# Patient Record
Sex: Female | Born: 1935 | Race: Black or African American | Hispanic: No | Marital: Married | Smoking: Never smoker
Health system: Southern US, Community
[De-identification: ages and names within clinical notes are randomized; demographics above are authoritative.]

## PROBLEM LIST (undated history)

## (undated) DIAGNOSIS — N183 Chronic kidney disease, stage 3 unspecified: Secondary | ICD-10-CM

## (undated) DIAGNOSIS — I5032 Chronic diastolic (congestive) heart failure: Secondary | ICD-10-CM

## (undated) DIAGNOSIS — E119 Type 2 diabetes mellitus without complications: Secondary | ICD-10-CM

## (undated) DIAGNOSIS — E785 Hyperlipidemia, unspecified: Secondary | ICD-10-CM

## (undated) DIAGNOSIS — I10 Essential (primary) hypertension: Secondary | ICD-10-CM

## (undated) DIAGNOSIS — D280 Benign neoplasm of vulva: Secondary | ICD-10-CM

## (undated) HISTORY — DX: Type 2 diabetes mellitus without complications: E11.9

## (undated) HISTORY — DX: Benign neoplasm of vulva: D28.0

## (undated) HISTORY — DX: Chronic kidney disease, stage 3 unspecified: N18.30

## (undated) HISTORY — PX: CYST EXCISION: SHX5701

## (undated) HISTORY — DX: Essential (primary) hypertension: I10

## (undated) HISTORY — DX: Hyperlipidemia, unspecified: E78.5

## (undated) HISTORY — PX: VULVA SURGERY: SHX837

## (undated) HISTORY — PX: OTHER SURGICAL HISTORY: SHX169

## (undated) HISTORY — DX: Chronic diastolic (congestive) heart failure: I50.32

## (undated) HISTORY — DX: Chronic kidney disease, stage 3 (moderate): N18.3

---

## 2012-09-11 ENCOUNTER — Inpatient Hospital Stay: Payer: Self-pay | Admitting: Internal Medicine

## 2012-09-11 LAB — CBC WITH DIFFERENTIAL/PLATELET
Basophil #: 0.2 10*3/uL — ABNORMAL HIGH (ref 0.0–0.1)
Basophil %: 1.7 %
Eosinophil #: 0.1 10*3/uL (ref 0.0–0.7)
Lymphocyte #: 3.4 10*3/uL (ref 1.0–3.6)
Lymphocyte %: 29.3 %
MCH: 31 pg (ref 26.0–34.0)
MCHC: 32.2 g/dL (ref 32.0–36.0)
MCV: 96 fL (ref 80–100)
Monocyte #: 0.8 x10 3/mm (ref 0.2–0.9)
Neutrophil %: 60.4 %
Platelet: 264 10*3/uL (ref 150–440)
RDW: 16.3 % — ABNORMAL HIGH (ref 11.5–14.5)
WBC: 11.6 10*3/uL — ABNORMAL HIGH (ref 3.6–11.0)

## 2012-09-11 LAB — BASIC METABOLIC PANEL
Anion Gap: 6 — ABNORMAL LOW (ref 7–16)
Calcium, Total: 8.9 mg/dL (ref 8.5–10.1)
Chloride: 106 mmol/L (ref 98–107)
Co2: 30 mmol/L (ref 21–32)
Creatinine: 1.26 mg/dL (ref 0.60–1.30)
EGFR (African American): 49 — ABNORMAL LOW
Osmolality: 292 (ref 275–301)

## 2012-09-12 LAB — BASIC METABOLIC PANEL
Anion Gap: 6 — ABNORMAL LOW (ref 7–16)
Chloride: 108 mmol/L — ABNORMAL HIGH (ref 98–107)
Co2: 29 mmol/L (ref 21–32)
Creatinine: 1.18 mg/dL (ref 0.60–1.30)
EGFR (African American): 53 — ABNORMAL LOW
EGFR (Non-African Amer.): 45 — ABNORMAL LOW
Glucose: 113 mg/dL — ABNORMAL HIGH (ref 65–99)
Osmolality: 288 (ref 275–301)
Potassium: 4 mmol/L (ref 3.5–5.1)

## 2012-09-12 LAB — CBC WITH DIFFERENTIAL/PLATELET
Basophil #: 0.1 10*3/uL (ref 0.0–0.1)
Basophil %: 0.9 %
Eosinophil #: 0.2 10*3/uL (ref 0.0–0.7)
Lymphocyte #: 1.9 10*3/uL (ref 1.0–3.6)
MCH: 31.1 pg (ref 26.0–34.0)
MCHC: 32.4 g/dL (ref 32.0–36.0)
MCV: 96 fL (ref 80–100)
Monocyte %: 9.3 %
Neutrophil %: 60.7 %
Platelet: 233 10*3/uL (ref 150–440)
RBC: 3.56 10*6/uL — ABNORMAL LOW (ref 3.80–5.20)
RDW: 15.9 % — ABNORMAL HIGH (ref 11.5–14.5)
WBC: 7.4 10*3/uL (ref 3.6–11.0)

## 2012-09-17 DIAGNOSIS — C519 Malignant neoplasm of vulva, unspecified: Secondary | ICD-10-CM | POA: Insufficient documentation

## 2012-09-17 LAB — CULTURE, BLOOD (SINGLE)

## 2012-09-30 ENCOUNTER — Emergency Department: Payer: Self-pay | Admitting: Internal Medicine

## 2013-02-02 DIAGNOSIS — E041 Nontoxic single thyroid nodule: Secondary | ICD-10-CM | POA: Insufficient documentation

## 2013-03-19 ENCOUNTER — Ambulatory Visit (INDEPENDENT_AMBULATORY_CARE_PROVIDER_SITE_OTHER): Payer: Medicare Other | Admitting: Cardiology

## 2013-03-19 DIAGNOSIS — R609 Edema, unspecified: Secondary | ICD-10-CM

## 2013-03-25 ENCOUNTER — Inpatient Hospital Stay: Payer: Self-pay | Admitting: Family Medicine

## 2013-03-25 LAB — COMPREHENSIVE METABOLIC PANEL
Albumin: 3.1 g/dL — ABNORMAL LOW (ref 3.4–5.0)
Anion Gap: 6 — ABNORMAL LOW (ref 7–16)
BUN: 19 mg/dL — ABNORMAL HIGH (ref 7–18)
Bilirubin,Total: 0.6 mg/dL (ref 0.2–1.0)
Chloride: 104 mmol/L (ref 98–107)
Co2: 29 mmol/L (ref 21–32)
EGFR (African American): 56 — ABNORMAL LOW
Osmolality: 287 (ref 275–301)
Potassium: 3.8 mmol/L (ref 3.5–5.1)
SGOT(AST): 17 U/L (ref 15–37)
SGPT (ALT): 27 U/L (ref 12–78)
Sodium: 139 mmol/L (ref 136–145)
Total Protein: 7.3 g/dL (ref 6.4–8.2)

## 2013-03-25 LAB — URINALYSIS, COMPLETE
Bacteria: NONE SEEN
Ketone: NEGATIVE
Leukocyte Esterase: NEGATIVE
Nitrite: NEGATIVE
Ph: 6 (ref 4.5–8.0)
RBC,UR: 2 /HPF (ref 0–5)
Specific Gravity: 1.016 (ref 1.003–1.030)
Squamous Epithelial: 2
WBC UR: <1 /HPF (ref 0–5)

## 2013-03-25 LAB — CBC WITH DIFFERENTIAL/PLATELET
Basophil #: 0.1 10*3/uL (ref 0.0–0.1)
Basophil %: 1.3 %
HCT: 42 % (ref 35.0–47.0)
HGB: 14.2 g/dL (ref 12.0–16.0)
Lymphocyte %: 28.4 %
MCHC: 33.7 g/dL (ref 32.0–36.0)
MCV: 95 fL (ref 80–100)
Monocyte %: 9.6 %
Neutrophil #: 5.4 10*3/uL (ref 1.4–6.5)
Platelet: 228 10*3/uL (ref 150–440)
RDW: 15.4 % — ABNORMAL HIGH (ref 11.5–14.5)
WBC: 9.1 10*3/uL (ref 3.6–11.0)

## 2013-03-25 LAB — LIPID PANEL
HDL Cholesterol: 51 mg/dL (ref 40–60)
Ldl Cholesterol, Calc: 70 mg/dL (ref 0–100)
Triglycerides: 85 mg/dL (ref 0–200)
VLDL Cholesterol, Calc: 17 mg/dL (ref 5–40)

## 2013-03-25 LAB — APTT
Activated PTT: 36.6 secs — ABNORMAL HIGH (ref 23.6–35.9)
Activated PTT: >160 secs (ref 23.6–35.9)

## 2013-03-25 LAB — TROPONIN I
Troponin-I: 0.12 ng/mL — ABNORMAL HIGH
Troponin-I: 0.13 ng/mL — ABNORMAL HIGH

## 2013-03-25 LAB — PROTIME-INR: Prothrombin Time: 13 secs (ref 11.5–14.7)

## 2013-03-25 LAB — CK TOTAL AND CKMB (NOT AT ARMC)
CK, Total: 99 U/L (ref 21–215)
CK-MB: 7.9 ng/mL — ABNORMAL HIGH (ref 0.5–3.6)

## 2013-03-25 LAB — HEMOGLOBIN A1C: Hemoglobin A1C: 7.2 % — ABNORMAL HIGH (ref 4.2–6.3)

## 2013-03-26 DIAGNOSIS — I10 Essential (primary) hypertension: Secondary | ICD-10-CM

## 2013-03-26 DIAGNOSIS — R7989 Other specified abnormal findings of blood chemistry: Secondary | ICD-10-CM

## 2013-03-26 DIAGNOSIS — I5031 Acute diastolic (congestive) heart failure: Secondary | ICD-10-CM

## 2013-03-26 LAB — COMPREHENSIVE METABOLIC PANEL
Alkaline Phosphatase: 114 U/L (ref 50–136)
Bilirubin,Total: 0.6 mg/dL (ref 0.2–1.0)
Calcium, Total: 9.1 mg/dL (ref 8.5–10.1)
Co2: 28 mmol/L (ref 21–32)
Creatinine: 1.03 mg/dL (ref 0.60–1.30)
EGFR (African American): >60
EGFR (Non-African Amer.): 53 — ABNORMAL LOW
SGOT(AST): 27 U/L (ref 15–37)
SGPT (ALT): 27 U/L (ref 12–78)
Sodium: 138 mmol/L (ref 136–145)
Total Protein: 7 g/dL (ref 6.4–8.2)

## 2013-03-26 LAB — TROPONIN I: Troponin-I: 0.16 ng/mL — ABNORMAL HIGH

## 2013-03-26 LAB — CBC WITH DIFFERENTIAL/PLATELET
Basophil #: 0.1 10*3/uL (ref 0.0–0.1)
Basophil %: 0.8 %
Eosinophil %: 1.4 %
HCT: 43 % (ref 35.0–47.0)
Lymphocyte #: 1.9 10*3/uL (ref 1.0–3.6)
Lymphocyte %: 19.4 %
MCH: 31.5 pg (ref 26.0–34.0)
MCHC: 33.3 g/dL (ref 32.0–36.0)
MCV: 95 fL (ref 80–100)
Neutrophil #: 6.7 10*3/uL — ABNORMAL HIGH (ref 1.4–6.5)
RBC: 4.54 10*6/uL (ref 3.80–5.20)
WBC: 9.6 10*3/uL (ref 3.6–11.0)

## 2013-03-26 LAB — CK TOTAL AND CKMB (NOT AT ARMC): CK, Total: 128 U/L (ref 21–215)

## 2013-03-26 LAB — APTT: Activated PTT: >160 secs (ref 23.6–35.9)

## 2013-03-27 LAB — BASIC METABOLIC PANEL
Anion Gap: 8 (ref 7–16)
BUN: 36 mg/dL — ABNORMAL HIGH (ref 7–18)
Co2: 29 mmol/L (ref 21–32)
Creatinine: 1.73 mg/dL — ABNORMAL HIGH (ref 0.60–1.30)
EGFR (African American): 33 — ABNORMAL LOW
EGFR (Non-African Amer.): 28 — ABNORMAL LOW
Glucose: 115 mg/dL — ABNORMAL HIGH (ref 65–99)
Osmolality: 283 (ref 275–301)
Potassium: 3.6 mmol/L (ref 3.5–5.1)

## 2013-03-29 ENCOUNTER — Encounter: Payer: Self-pay | Admitting: *Deleted

## 2013-03-30 ENCOUNTER — Ambulatory Visit: Payer: Medicare Other | Admitting: Cardiovascular Disease

## 2013-03-31 ENCOUNTER — Emergency Department: Payer: Self-pay | Admitting: Emergency Medicine

## 2013-03-31 LAB — CBC
HGB: 14.1 g/dL (ref 12.0–16.0)
MCHC: 34.3 g/dL (ref 32.0–36.0)
MCV: 93 fL (ref 80–100)
RBC: 4.39 10*6/uL (ref 3.80–5.20)

## 2013-03-31 LAB — URINALYSIS, COMPLETE
Bilirubin,UR: NEGATIVE
Nitrite: NEGATIVE
Protein: NEGATIVE
RBC,UR: 1 /HPF (ref 0–5)

## 2013-03-31 LAB — BASIC METABOLIC PANEL
Anion Gap: 8 (ref 7–16)
BUN: 28 mg/dL — ABNORMAL HIGH (ref 7–18)
Co2: 27 mmol/L (ref 21–32)
Potassium: 3.8 mmol/L (ref 3.5–5.1)
Sodium: 142 mmol/L (ref 136–145)

## 2013-03-31 LAB — PRO B NATRIURETIC PEPTIDE: B-Type Natriuretic Peptide: 929 pg/mL — ABNORMAL HIGH (ref 0–450)

## 2013-04-27 ENCOUNTER — Other Ambulatory Visit: Payer: Self-pay | Admitting: *Deleted

## 2013-04-27 ENCOUNTER — Ambulatory Visit (INDEPENDENT_AMBULATORY_CARE_PROVIDER_SITE_OTHER): Payer: Medicare Other | Admitting: Cardiovascular Disease

## 2013-04-27 ENCOUNTER — Encounter: Payer: Self-pay | Admitting: Cardiovascular Disease

## 2013-04-27 VITALS — BP 207/80 | HR 50 | Ht 60.0 in | Wt 206.8 lb

## 2013-04-27 DIAGNOSIS — R0602 Shortness of breath: Secondary | ICD-10-CM

## 2013-04-27 DIAGNOSIS — I5032 Chronic diastolic (congestive) heart failure: Secondary | ICD-10-CM

## 2013-04-27 DIAGNOSIS — I10 Essential (primary) hypertension: Secondary | ICD-10-CM

## 2013-04-27 MED ORDER — CARVEDILOL 6.25 MG PO TABS: 6.2500 mg | ORAL_TABLET | Freq: Two times a day (BID) | ORAL | Status: DC

## 2013-04-27 MED ORDER — FUROSEMIDE 20 MG PO TABS: 20.0000 mg | ORAL_TABLET | Freq: Every day | ORAL | Status: DC

## 2013-04-27 MED ORDER — AMLODIPINE BESYLATE 5 MG PO TABS: 5.0000 mg | ORAL_TABLET | Freq: Every day | ORAL | Status: DC

## 2013-04-27 NOTE — Progress Notes (Signed)
Primary care physician: Dr. Dario Guardian.   HPI  This is a 77 year old [place] American female who is here today for a followup visit after recent hospitalization at [facility]. She has known history of hypertension and diabetes. She was hospitalized in June of this year due to worsening dyspnea and 20 pounds weight gain with lower extremity edema and swelling in her back. She was hypotensive on presentation with blood pressure of 206/78. Head BNP was greater than 800. She improved quickly with IV Lasix. Cardiac enzymes were mildly elevated. She ran out of her blood pressure medications few weeks before presentation. She underwent a nuclear stress test with Dr. Dario Guardian which was negative for ischemia at submaximal work load. Echocardiogram while hospitalized showed hyperdynamic LV systolic function with severe asymmetrical left ventricular hypertrophy without LVOT gradient and no significant valvular abnormalities. She reports improvement in her symptoms but blood pressure is still elevated.  Allergies  Allergen Reactions  . Dye Fdc Red (Red Dye)      Current Outpatient Prescriptions on File Prior to Visit  Medication Sig Dispense Refill  . aspirin 81 MG tablet Take 81 mg by mouth daily.      Marland Kitchen atorvastatin (LIPITOR) 20 MG tablet Take 20 mg by mouth daily.      . Cholecalciferol (VITAMIN D3) 5000 UNITS TABS Take by mouth once a week.      . insulin NPH-regular (NOVOLIN 70/30) (70-30) 100 UNIT/ML injection Inject 55 Units into the skin daily with breakfast. Takes 35 units pm daily.      Marland Kitchen losartan (COZAAR) 100 MG tablet Take 100 mg by mouth daily.       No current facility-administered medications on file prior to visit.     Past Medical History  Diagnosis Date  . Diabetes mellitus without complication   . Benign tumor of labia majora     removed  . Acute diastolic congestive heart failure   . Acute kidney injury   . Heart murmur   . Hyperlipidemia   . Chronic diastolic heart failure   .  Hypertension      Past Surgical History  Procedure Laterality Date  . Hysterectomy    . Vulva surgery       Family History  Problem Relation Age of Onset  . Hypertension Mother   . Hypertension Father      History   Social History  . Marital Status: Married    Spouse Name: N/A    Number of Children: N/A  . Years of Education: N/A   Occupational History  . Not on file.   Social History Main Topics  . Smoking status: Never Smoker   . Smokeless tobacco: Not on file  . Alcohol Use: No  . Drug Use: No  . Sexually Active: Not on file   Other Topics Concern  . Not on file   Social History Narrative  . No narrative on file     PHYSICAL EXAM   BP 207/80  Pulse 50  Ht 5' (1.524 m)  Wt 206 lb 12 oz (93.781 kg)  BMI 40.38 kg/m2 Constitutional: She is oriented to person, place, and time. She appears well-developed and well-nourished. No distress.  HENT: No nasal discharge.  Head: Normocephalic and atraumatic.  Eyes: Pupils are equal and round. Right eye exhibits no discharge. Left eye exhibits no discharge.  Neck: Normal range of motion. Neck supple. No JVD present. No thyromegaly present.  Cardiovascular: Normal rate, regular rhythm, normal heart sounds. Exam reveals no gallop  and no friction rub. No murmur heard.  Pulmonary/Chest: Effort normal and breath sounds normal. No stridor. No respiratory distress. She has no wheezes. She has no rales. She exhibits no tenderness.  Abdominal: Soft. Bowel sounds are normal. She exhibits no distension. There is no tenderness. There is no rebound and no guarding.  Musculoskeletal: Normal range of motion. She exhibits no edema and no tenderness.  Neurological: She is alert and oriented to person, place, and time. Coordination normal.  Skin: Skin is warm and dry. No rash noted. She is not diaphoretic. No erythema. No pallor.  Psychiatric: She has a normal mood and affect. Her behavior is normal. Judgment and thought content  normal.     EKG: Sinus bradycardia with left ventricular hypertrophy with QRS widening and repolarization abnormality.   ASSESSMENT AND PLAN

## 2013-04-27 NOTE — Assessment & Plan Note (Addendum)
This is due to severe hypertension. She appears to be mildly fluid overloaded. I recommend changing the dose of Lasix to 20 mg every day instead of every other day. I discussed with her the importance of low sodium diet. Recent nuclear stress test showed no evidence of ischemia at submaximal workload.

## 2013-04-27 NOTE — Patient Instructions (Addendum)
Stop Metoprolol.  Start Carvedilol 6.26 mg twice daily.  Start Amlodipine 5 mg once daily.  Change Furosemide (Lasix) to 20 mg once daily.   Follow up in 1 month

## 2013-04-27 NOTE — Assessment & Plan Note (Signed)
Her blood pressure is still significantly elevated. I will switch metoprolol to carvedilol 6.25 mg twice daily especially that she is mildly bradycardic. I will add amlodipine 5 mg once daily. Continue treatment with losartan. If her blood pressure remains elevated, I will screen her for secondary hypertension.

## 2013-05-14 ENCOUNTER — Ambulatory Visit: Payer: Medicare Other | Admitting: Cardiovascular Disease

## 2013-05-28 ENCOUNTER — Encounter: Payer: Self-pay | Admitting: Cardiovascular Disease

## 2013-05-28 ENCOUNTER — Ambulatory Visit (INDEPENDENT_AMBULATORY_CARE_PROVIDER_SITE_OTHER): Payer: Medicare Other | Admitting: Cardiovascular Disease

## 2013-05-28 VITALS — BP 181/72 | HR 52 | Ht 60.0 in | Wt 206.0 lb

## 2013-05-28 DIAGNOSIS — I10 Essential (primary) hypertension: Secondary | ICD-10-CM

## 2013-05-28 DIAGNOSIS — I509 Heart failure, unspecified: Secondary | ICD-10-CM

## 2013-05-28 DIAGNOSIS — I5032 Chronic diastolic (congestive) heart failure: Secondary | ICD-10-CM

## 2013-05-28 MED ORDER — SPIRONOLACTONE 25 MG PO TABS: 25.0000 mg | ORAL_TABLET | Freq: Every day | ORAL | Status: DC

## 2013-05-28 NOTE — Patient Instructions (Addendum)
Start Spironolactone 25 mg once daily.  Continue other medications.   Come back for labs next week on Thursday.   Follow up in 1 month.

## 2013-05-28 NOTE — Progress Notes (Signed)
Primary care physician: Dr. Dario Guardian.   HPI  This is a 77 year old African American female who is here today for a followup visit after regarding chronic diastolic heart failure and refractory hypertension. She was hospitalized in June of this year due to worsening dyspnea and 20 pounds weight gain with lower extremity edema and swelling in her back. She was hypertensive on presentation with blood pressure of 206/78.  BNP was greater than 800. She improved quickly with IV Lasix. Cardiac enzymes were mildly elevated. She ran out of her blood pressure medications few weeks before presentation. She underwent a nuclear stress test with Dr. Dario Guardian which was negative for ischemia at submaximal work load. Echocardiogram while hospitalized showed hyperdynamic LV systolic function with severe asymmetrical left ventricular hypertrophy without LVOT gradient and no significant valvular abnormalities. I switched her to carvedilol during last visit. Overall, she feels better. Blood pressure is improving but is still not controlled.  Allergies  Allergen Reactions  . Dye Fdc Red [Red Dye]      Current Outpatient Prescriptions on File Prior to Visit  Medication Sig Dispense Refill  . Albuterol Sulfate (PROAIR HFA IN) Inhale into the lungs as needed.      Marland Kitchen amLODipine (NORVASC) 5 MG tablet Take 1 tablet (5 mg total) by mouth daily.  30 tablet  6  . aspirin 81 MG tablet Take 81 mg by mouth daily.      Marland Kitchen atorvastatin (LIPITOR) 20 MG tablet Take 20 mg by mouth daily.      . carvedilol (COREG) 6.25 MG tablet Take 1 tablet (6.25 mg total) by mouth 2 (two) times daily.  60 tablet  6  . Cholecalciferol (VITAMIN D3) 5000 UNITS TABS Take by mouth once a week.      . furosemide (LASIX) 20 MG tablet Take 1 tablet (20 mg total) by mouth daily.  30 tablet  6  . insulin NPH-regular (NOVOLIN 70/30) (70-30) 100 UNIT/ML injection Inject 55 Units into the skin daily with breakfast. Takes 35 units pm daily.      Marland Kitchen losartan  (COZAAR) 100 MG tablet Take 100 mg by mouth daily.       No current facility-administered medications on file prior to visit.     Past Medical History  Diagnosis Date  . Diabetes mellitus without complication   . Benign tumor of labia majora     removed  . Acute diastolic congestive heart failure   . Acute kidney injury   . Heart murmur   . Hyperlipidemia   . Chronic diastolic heart failure   . Hypertension      Past Surgical History  Procedure Laterality Date  . Hysterectomy    . Vulva surgery       Family History  Problem Relation Age of Onset  . Hypertension Mother   . Hypertension Father      History   Social History  . Marital Status: Married    Spouse Name: N/A    Number of Children: N/A  . Years of Education: N/A   Occupational History  . Not on file.   Social History Main Topics  . Smoking status: Never Smoker   . Smokeless tobacco: Not on file  . Alcohol Use: No  . Drug Use: No  . Sexual Activity: Not on file   Other Topics Concern  . Not on file   Social History Narrative  . No narrative on file     PHYSICAL EXAM   BP 181/72  Pulse 52  Ht 5' (1.524 m)  Wt 206 lb (93.441 kg)  BMI 40.23 kg/m2 Constitutional: She is oriented to person, place, and time. She appears well-developed and well-nourished. No distress.  HENT: No nasal discharge.  Head: Normocephalic and atraumatic.  Eyes: Pupils are equal and round. Right eye exhibits no discharge. Left eye exhibits no discharge.  Neck: Normal range of motion. Neck supple. No JVD present. No thyromegaly present.  Cardiovascular: Normal rate, regular rhythm, normal heart sounds. Exam reveals no gallop and no friction rub. No murmur heard.  Pulmonary/Chest: Effort normal and breath sounds normal. No stridor. No respiratory distress. She has no wheezes. She has no rales. She exhibits no tenderness.  Abdominal: Soft. Bowel sounds are normal. She exhibits no distension. There is no tenderness.  There is no rebound and no guarding.  Musculoskeletal: Normal range of motion. She exhibits +1 edema and no tenderness.  Neurological: She is alert and oriented to person, place, and time. Coordination normal.  Skin: Skin is warm and dry. No rash noted. She is not diaphoretic. No erythema. No pallor.  Psychiatric: She has a normal mood and affect. Her behavior is normal. Judgment and thought content normal.       ASSESSMENT AND PLAN

## 2013-05-30 NOTE — Assessment & Plan Note (Signed)
Blood pressure improved but is still uncontrolled. She is currently on amlodipine, carvedilol, losartan and Lasix. I recommend adding spironolactone 25 mg once daily. I will check basic metabolic profile in one week and have her followup in one month. She will likely require screening for sleep apnea as well as renal artery stenosis.

## 2013-05-30 NOTE — Assessment & Plan Note (Signed)
She still having some no extremity edema but overall she has improved. Biggest issue now is controlling her blood pressure which seems to be refractory. I cannot increase the dose of carvedilol due to bradycardia. I might consider changing Lasix to twice daily or switching to a thiazide diuretic.

## 2013-06-01 ENCOUNTER — Other Ambulatory Visit: Payer: Self-pay | Admitting: *Deleted

## 2013-06-01 NOTE — Telephone Encounter (Signed)
Spoke w/ pt.  She reports "red bumps", 3-4 on her shoulder and one big one on her stomach.  She previously had a reaction to red dye, and this was "how it started last time".  She is afraid to take her spirololactone, as this is a new med that she started on 8/22.  She states that she won't take any more until she hears back from Dr. Kirke Corin, as she doesn't want her reaction to worsen.  Please advise. Thank you.

## 2013-06-01 NOTE — Telephone Encounter (Signed)
Patient call and is taking a stronger dose of Furosemide and is causing a rash.

## 2013-06-01 NOTE — Telephone Encounter (Signed)
Spoke w/ pt.  She will stop the Spironolactone and increase her amlodipine to 10 mg daily and change Lasix to 20 mg BID.    Renal artery duplex u/s sched for 06/09/13 here in the [place] office.

## 2013-06-01 NOTE — Telephone Encounter (Signed)
She is probably allergic to Spironolactone which should be stopped.  Increase Amlodipine to 10 mg once daily and change Lasix to 20 mg bid.  Schedule renal artery duplex ultrasound to make sure she has no blockages in renal arteries

## 2013-06-02 MED ORDER — AMLODIPINE BESYLATE 5 MG PO TABS: 10.0000 mg | ORAL_TABLET | Freq: Every day | ORAL | Status: DC

## 2013-06-03 ENCOUNTER — Other Ambulatory Visit: Payer: Medicare Other

## 2013-06-10 ENCOUNTER — Encounter (INDEPENDENT_AMBULATORY_CARE_PROVIDER_SITE_OTHER): Payer: Medicare Other

## 2013-06-10 DIAGNOSIS — I10 Essential (primary) hypertension: Secondary | ICD-10-CM

## 2013-06-15 ENCOUNTER — Telehealth: Payer: Self-pay

## 2013-06-15 NOTE — Telephone Encounter (Signed)
Spoke w/ pt.  She is aware of results, but states that she is still having some swelling in her "arms & legs", with occasional SOB. Pt takes lasix bid and wants to know if you would like to "change her medications around".

## 2013-06-15 NOTE — Telephone Encounter (Signed)
Message copied by Marilynne Halsted on Tue Jun 15, 2013  8:47 AM ------      Message from: Lorine Bears A      Created: Sat Jun 12, 2013 12:51 PM       No evidence of renal artery stenosis. ------

## 2013-06-15 NOTE — Telephone Encounter (Signed)
Sending to you again.

## 2013-06-16 NOTE — Telephone Encounter (Signed)
Pt verbalizes understanding and will increase lasix to 40 mg in the morning and 20 mg in afternoon.

## 2013-06-16 NOTE — Telephone Encounter (Signed)
Increase lasix to 40 mg in am and 20 mg in afternoon.  Keep follow up appointment later this month.

## 2013-06-29 ENCOUNTER — Ambulatory Visit (INDEPENDENT_AMBULATORY_CARE_PROVIDER_SITE_OTHER): Payer: Medicare Other | Admitting: Cardiovascular Disease

## 2013-06-29 ENCOUNTER — Encounter: Payer: Self-pay | Admitting: Cardiovascular Disease

## 2013-06-29 VITALS — BP 156/74 | HR 55 | Ht 60.0 in | Wt 202.0 lb

## 2013-06-29 DIAGNOSIS — I5032 Chronic diastolic (congestive) heart failure: Secondary | ICD-10-CM

## 2013-06-29 DIAGNOSIS — I10 Essential (primary) hypertension: Secondary | ICD-10-CM

## 2013-06-29 NOTE — Assessment & Plan Note (Signed)
Blood pressure is more controlled than before. There was no evidence of renal artery stenosis. I suspect that high sodium intake is contributing to her hypertension. Also consider evaluation for sleep apnea.

## 2013-06-29 NOTE — Assessment & Plan Note (Signed)
She seems to be improved. I had a prolonged discussion with her about the importance of low sodium diet. Continue current dose of Lasix.

## 2013-06-29 NOTE — Progress Notes (Signed)
Primary care physician: Dr. Dario Guardian.   HPI  This is a 77 year old African American female who is here today for a followup visit after regarding chronic diastolic heart failure and refractory hypertension. She was hospitalized in June of this year due to worsening dyspnea and 20 pounds weight gain with lower extremity edema and swelling in her back. She was hypertensive on presentation with blood pressure of 206/78.  BNP was greater than 800. She improved quickly with IV Lasix. Cardiac enzymes were mildly elevated. She ran out of her blood pressure medications few weeks before presentation. She underwent a nuclear stress test with Dr. Dario Guardian which was negative for ischemia at submaximal work load. Echocardiogram while hospitalized showed hyperdynamic LV systolic function with severe asymmetrical left ventricular hypertrophy without LVOT gradient and no significant valvular abnormalities.  Renal artery duplex showed no evidence of renal artery stenosis. Blood pressure is more controlled after adjusting her medications. She consumes excessive amount of sodium. She likes french fries and chips.   Allergies  Allergen Reactions  . Dye Fdc Red [Red Dye]   . Spironolactone Rash     Current Outpatient Prescriptions on File Prior to Visit  Medication Sig Dispense Refill  . Albuterol Sulfate (PROAIR HFA IN) Inhale into the lungs as needed.      Marland Kitchen amLODipine (NORVASC) 5 MG tablet Take 2 tablets (10 mg total) by mouth daily.  30 tablet  6  . aspirin 81 MG tablet Take 81 mg by mouth daily.      Marland Kitchen atorvastatin (LIPITOR) 20 MG tablet Take 20 mg by mouth daily.      . carvedilol (COREG) 6.25 MG tablet Take 1 tablet (6.25 mg total) by mouth 2 (two) times daily.  60 tablet  6  . Cholecalciferol (VITAMIN D3) 5000 UNITS TABS Take by mouth once a week.      . furosemide (LASIX) 20 MG tablet Take 20 mg by mouth daily.       . insulin NPH-regular (NOVOLIN 70/30) (70-30) 100 UNIT/ML injection Inject 55 Units  into the skin daily with breakfast. Takes 35 units pm daily.      Marland Kitchen losartan (COZAAR) 100 MG tablet Take 100 mg by mouth daily.       No current facility-administered medications on file prior to visit.     Past Medical History  Diagnosis Date  . Diabetes mellitus without complication   . Benign tumor of labia majora     removed  . Acute diastolic congestive heart failure   . Acute kidney injury   . Heart murmur   . Hyperlipidemia   . Chronic diastolic heart failure   . Hypertension      Past Surgical History  Procedure Laterality Date  . Hysterectomy    . Vulva surgery       Family History  Problem Relation Age of Onset  . Hypertension Mother   . Hypertension Father      History   Social History  . Marital Status: Married    Spouse Name: N/A    Number of Children: N/A  . Years of Education: N/A   Occupational History  . Not on file.   Social History Main Topics  . Smoking status: Never Smoker   . Smokeless tobacco: Not on file  . Alcohol Use: No  . Drug Use: No  . Sexual Activity: Not on file   Other Topics Concern  . Not on file   Social History Narrative  . No narrative  on file     PHYSICAL EXAM   BP 156/74  Pulse 55  Ht 5' (1.524 m)  Wt 202 lb (91.627 kg)  BMI 39.45 kg/m2 Constitutional: She is oriented to person, place, and time. She appears well-developed and well-nourished. No distress.  HENT: No nasal discharge.  Head: Normocephalic and atraumatic.  Eyes: Pupils are equal and round. Right eye exhibits no discharge. Left eye exhibits no discharge.  Neck: Normal range of motion. Neck supple. No JVD present. No thyromegaly present.  Cardiovascular: Normal rate, regular rhythm, normal heart sounds. Exam reveals no gallop and no friction rub. No murmur heard.  Pulmonary/Chest: Effort normal and breath sounds normal. No stridor. No respiratory distress. She has no wheezes. She has no rales. She exhibits no tenderness.  Abdominal: Soft.  Bowel sounds are normal. She exhibits no distension. There is no tenderness. There is no rebound and no guarding.  Musculoskeletal: Normal range of motion. She exhibits +1 edema and no tenderness.  Neurological: She is alert and oriented to person, place, and time. Coordination normal.  Skin: Skin is warm and dry. No rash noted. She is not diaphoretic. No erythema. No pallor.  Psychiatric: She has a normal mood and affect. Her behavior is normal. Judgment and thought content normal.       ASSESSMENT AND PLAN

## 2013-06-29 NOTE — Patient Instructions (Addendum)
Continue same medications.  Follow up in 6 months.  

## 2013-08-25 ENCOUNTER — Emergency Department: Payer: Self-pay | Admitting: Emergency Medicine

## 2013-08-25 LAB — URINALYSIS, COMPLETE
Bilirubin,UR: NEGATIVE
Glucose,UR: NEGATIVE mg/dL (ref 0–75)
Ketone: NEGATIVE
Leukocyte Esterase: NEGATIVE
Nitrite: NEGATIVE
Specific Gravity: 1.008 (ref 1.003–1.030)
WBC UR: 1 /HPF (ref 0–5)

## 2013-08-25 LAB — COMPREHENSIVE METABOLIC PANEL
Albumin: 3.2 g/dL — ABNORMAL LOW (ref 3.4–5.0)
Alkaline Phosphatase: 142 U/L — ABNORMAL HIGH (ref 50–136)
BUN: 11 mg/dL (ref 7–18)
Bilirubin,Total: 0.8 mg/dL (ref 0.2–1.0)
Chloride: 103 mmol/L (ref 98–107)
Co2: 30 mmol/L (ref 21–32)
Creatinine: 0.95 mg/dL (ref 0.60–1.30)
EGFR (African American): >60
EGFR (Non-African Amer.): 59 — ABNORMAL LOW
Glucose: 185 mg/dL — ABNORMAL HIGH (ref 65–99)
SGOT(AST): 19 U/L (ref 15–37)
SGPT (ALT): 20 U/L (ref 12–78)
Sodium: 138 mmol/L (ref 136–145)

## 2013-08-25 LAB — CK TOTAL AND CKMB (NOT AT ARMC)
CK, Total: 68 U/L (ref 21–215)
CK-MB: 6.3 ng/mL — ABNORMAL HIGH (ref 0.5–3.6)

## 2013-08-25 LAB — CBC
HGB: 15.9 g/dL (ref 12.0–16.0)
MCH: 31.1 pg (ref 26.0–34.0)
MCHC: 33.7 g/dL (ref 32.0–36.0)
MCV: 92 fL (ref 80–100)
RBC: 5.11 10*6/uL (ref 3.80–5.20)
WBC: 7.8 10*3/uL (ref 3.6–11.0)

## 2013-08-27 ENCOUNTER — Encounter: Payer: Self-pay | Admitting: Cardiovascular Disease

## 2013-08-27 ENCOUNTER — Ambulatory Visit (INDEPENDENT_AMBULATORY_CARE_PROVIDER_SITE_OTHER): Payer: Medicare Other | Admitting: Cardiovascular Disease

## 2013-08-27 ENCOUNTER — Other Ambulatory Visit: Payer: Self-pay | Admitting: Cardiovascular Disease

## 2013-08-27 VITALS — BP 146/80 | HR 56 | Ht 66.0 in | Wt 188.8 lb

## 2013-08-27 DIAGNOSIS — R609 Edema, unspecified: Secondary | ICD-10-CM

## 2013-08-27 DIAGNOSIS — I10 Essential (primary) hypertension: Secondary | ICD-10-CM

## 2013-08-27 DIAGNOSIS — R000 Tachycardia, unspecified: Secondary | ICD-10-CM

## 2013-08-27 DIAGNOSIS — R0609 Other forms of dyspnea: Secondary | ICD-10-CM

## 2013-08-27 DIAGNOSIS — I5032 Chronic diastolic (congestive) heart failure: Secondary | ICD-10-CM

## 2013-08-27 DIAGNOSIS — R0600 Dyspnea, unspecified: Secondary | ICD-10-CM

## 2013-08-27 LAB — BASIC METABOLIC PANEL
Anion Gap: 6 — ABNORMAL LOW (ref 7–16)
BUN: 17 mg/dL (ref 7–18)
Calcium, Total: 9.2 mg/dL (ref 8.5–10.1)
Co2: 29 mmol/L (ref 21–32)
Creatinine: 1.17 mg/dL (ref 0.60–1.30)
EGFR (Non-African Amer.): 46 — ABNORMAL LOW
Glucose: 185 mg/dL — ABNORMAL HIGH (ref 65–99)
Osmolality: 284 (ref 275–301)
Potassium: 3.3 mmol/L — ABNORMAL LOW (ref 3.5–5.1)

## 2013-08-27 LAB — HEPATIC FUNCTION PANEL A (ARMC)
Albumin: 3.3 g/dL — ABNORMAL LOW (ref 3.4–5.0)
Alkaline Phosphatase: 117 U/L
Bilirubin,Total: 0.8 mg/dL (ref 0.2–1.0)
SGPT (ALT): 19 U/L (ref 12–78)

## 2013-08-27 LAB — PRO B NATRIURETIC PEPTIDE: B-Type Natriuretic Peptide: 1035 pg/mL — ABNORMAL HIGH (ref 0–450)

## 2013-08-27 NOTE — Patient Instructions (Addendum)
Labs today. BMET, BNP, TSH, LFT, CORTISOL  Continue same medications.   Follow up ON 11/30/13 @ 2:45 WITH DR. Kirke Corin

## 2013-08-27 NOTE — Assessment & Plan Note (Signed)
Blood pressure actually significantly improved on current medications which will be continued.

## 2013-08-27 NOTE — Assessment & Plan Note (Signed)
It is interesting that the patient lost about 15 pounds since her last visit yet she is complaining of increased lower extremity edema, abdominal swelling and swelling in her back. It is not entirely clear if this is related to fluid overload but does not seem to be the case. I will check basic metabolic profile and BNP given the recent increase in the dose of Lasix. She also had hypokalemia and currently not on replacement. She does a buffalo hump sign and thus I will check cortisol level. I will also check thyroid function. I asked him to followup with Dr.Jadali as well. Saxagliptin can cause peripheral edema but I am not familiar with this medication.

## 2013-08-27 NOTE — Progress Notes (Signed)
Primary care physician: Dr. Dario Guardian.   HPI  This is a 77 year old African American female who is here today for a followup visit regarding chronic diastolic heart failure and refractory hypertension. She was hospitalized in June of this year due to worsening dyspnea and 20 pounds weight gain with lower extremity edema and swelling in her back. She was hypertensive on presentation with blood pressure of 206/78.  BNP was greater than 800. She improved quickly with IV Lasix. Cardiac enzymes were mildly elevated. She ran out of her blood pressure medications few weeks before presentation. She underwent a nuclear stress test with Dr. Dario Guardian which was negative for ischemia at submaximal work load. Echocardiogram while hospitalized showed hyperdynamic LV systolic function with severe asymmetrical left ventricular hypertrophy without LVOT gradient and no significant valvular abnormalities.  Renal artery duplex showed no evidence of renal artery stenosis. Blood pressure is more controlled after adjusting her medications.  She reports worsening lower extremity edema, abdominal swelling and swelling in her back below the shoulder blades. This has been associated with increased dyspnea and orthopnea. This is in spite of losing about 15 pounds since last visit. She went to the emergency room at [facility] 2 days ago. Labs were unremarkable except for mild hypokalemia at 3.2. Albumin was mildly low at 3.2. Troponin was 0.12. Chest x-ray showed no acute changes. She was discharged home and was asked to increase Lasix to 20 mg twice daily. She also complains of palpitations. She reports being started on Saxagliptin for diabetes last month. She denies any chest pain.    Allergies  Allergen Reactions  . Dye Fdc Red [Red Dye]   . Spironolactone Rash     Current Outpatient Prescriptions on File Prior to Visit  Medication Sig Dispense Refill  . Albuterol Sulfate (PROAIR HFA IN) Inhale into the lungs as needed.      Marland Kitchen  amLODipine (NORVASC) 5 MG tablet Take 2 tablets (10 mg total) by mouth daily.  30 tablet  6  . aspirin 81 MG tablet Take 81 mg by mouth daily.      Marland Kitchen atorvastatin (LIPITOR) 20 MG tablet Take 20 mg by mouth daily.      . carvedilol (COREG) 6.25 MG tablet Take 1 tablet (6.25 mg total) by mouth 2 (two) times daily.  60 tablet  6  . furosemide (LASIX) 20 MG tablet Take 20 mg by mouth daily.       . insulin glargine (LANTUS) 100 UNIT/ML injection Inject 20 Units into the skin at bedtime.       Marland Kitchen losartan (COZAAR) 100 MG tablet Take 100 mg by mouth daily.       No current facility-administered medications on file prior to visit.     Past Medical History  Diagnosis Date  . Diabetes mellitus without complication   . Benign tumor of labia majora     removed  . Acute diastolic congestive heart failure   . Acute kidney injury   . Heart murmur   . Hyperlipidemia   . Chronic diastolic heart failure   . Hypertension      Past Surgical History  Procedure Laterality Date  . Hysterectomy    . Vulva surgery       Family History  Problem Relation Age of Onset  . Hypertension Mother   . Hypertension Father      History   Social History  . Marital Status: Married    Spouse Name: N/A    Number of Children:  N/A  . Years of Education: N/A   Occupational History  . Not on file.   Social History Main Topics  . Smoking status: Never Smoker   . Smokeless tobacco: Not on file  . Alcohol Use: No  . Drug Use: No  . Sexual Activity: Not on file   Other Topics Concern  . Not on file   Social History Narrative  . No narrative on file     PHYSICAL EXAM   BP 146/80  Pulse 56  Ht 5\' 6"  (1.676 m)  Wt 188 lb 12 oz (85.616 kg)  BMI 30.48 kg/m2 Constitutional: She is oriented to person, place, and time. She appears well-developed and well-nourished. No distress.  HENT: No nasal discharge.  Head: Normocephalic and atraumatic.  Eyes: Pupils are equal and round. Right eye exhibits no  discharge. Left eye exhibits no discharge.  Neck: Normal range of motion. Neck supple. No JVD present. No thyromegaly present.  Cardiovascular: Normal rate, regular rhythm, normal heart sounds. Exam reveals no gallop and no friction rub. No murmur heard.  Pulmonary/Chest: Effort normal and breath sounds normal. No stridor. No respiratory distress. She has no wheezes. She has no rales. She exhibits no tenderness.  Abdominal: Soft. Bowel sounds are normal. She exhibits no distension. There is no tenderness. There is no rebound and no guarding.  Musculoskeletal: Normal range of motion. She exhibits +1 edema and no tenderness.  Neurological: She is alert and oriented to person, place, and time. Coordination normal.  Skin: Skin is warm and dry. No rash noted. She is not diaphoretic. No erythema. No pallor.  Psychiatric: She has a normal mood and affect. Her behavior is normal. Judgment and thought content normal.    EKG: Sinus bradycardia with left ventricular hypertrophy and IVCD.   ASSESSMENT AND PLAN

## 2013-08-30 ENCOUNTER — Telehealth: Payer: Self-pay

## 2013-08-30 NOTE — Telephone Encounter (Signed)
DR Dario Guardian wants to add an A1C to existing orders if possible. Please advise

## 2013-08-30 NOTE — Telephone Encounter (Signed)
That is fine if it can be done.

## 2013-08-30 NOTE — Telephone Encounter (Signed)
Spoke w/ pt.  She reports that she had her blood drawn on Friday. Called [facility] to add, but could not get through, as it is after 5:00.

## 2013-08-31 NOTE — Telephone Encounter (Signed)
Spoke w/ [facility] representative who said we cannot add HgbA1C, as they had a tube of room temp serum and need whole blood in order to run that test.

## 2013-09-06 ENCOUNTER — Telehealth: Payer: Self-pay | Admitting: *Deleted

## 2013-09-06 DIAGNOSIS — I5032 Chronic diastolic (congestive) heart failure: Secondary | ICD-10-CM

## 2013-09-06 MED ORDER — POTASSIUM CHLORIDE CRYS ER 20 MEQ PO TBCR: 20.0000 meq | EXTENDED_RELEASE_TABLET | Freq: Every day | ORAL | Status: DC

## 2013-09-06 NOTE — Telephone Encounter (Signed)
Labs were fine except for mildly low potassium. Start K-dur 20 meq once daily.

## 2013-09-06 NOTE — Telephone Encounter (Signed)
Spoke w/ pt.  She is aware of results and agreeable to starting K-dur.  Pt reports that she spoke w/ her PCP about edema, but reports that "he can't do anything.  He is mostly a Immunologist". She would like to speak with her daughter about possibly seeking a new PCP and call back if she would like a recommendation from Dr. Kirke Corin.

## 2013-09-06 NOTE — Telephone Encounter (Signed)
Left message for pt to call back  °

## 2013-09-06 NOTE — Telephone Encounter (Signed)
Patient wanting lab results. 

## 2013-10-19 ENCOUNTER — Ambulatory Visit (INDEPENDENT_AMBULATORY_CARE_PROVIDER_SITE_OTHER): Payer: Medicare Other | Admitting: Cardiovascular Disease

## 2013-10-19 ENCOUNTER — Encounter: Payer: Self-pay | Admitting: Cardiovascular Disease

## 2013-10-19 ENCOUNTER — Telehealth: Payer: Self-pay

## 2013-10-19 VITALS — BP 148/76 | HR 64 | Ht 62.0 in | Wt 173.8 lb

## 2013-10-19 DIAGNOSIS — I10 Essential (primary) hypertension: Secondary | ICD-10-CM

## 2013-10-19 DIAGNOSIS — R0602 Shortness of breath: Secondary | ICD-10-CM

## 2013-10-19 DIAGNOSIS — I5032 Chronic diastolic (congestive) heart failure: Secondary | ICD-10-CM

## 2013-10-19 NOTE — Progress Notes (Addendum)
Primary care physician: Dr. Rosario Jacks.   HPI  This is a 78 year old African American female who is here today for a followup visit regarding chronic diastolic heart failure and refractory hypertension. She was hospitalized in June of this year due to worsening dyspnea and 20 pounds weight gain with lower extremity edema and swelling in her back. She was hypertensive on presentation with blood pressure of 206/78.  BNP was greater than 800. She improved quickly with IV Lasix. Cardiac enzymes were mildly elevated. She ran out of her blood pressure medications few weeks before presentation. She underwent a nuclear stress test with Dr. Rosario Jacks which was negative for ischemia at submaximal work load. Echocardiogram while hospitalized showed hyperdynamic LV systolic function with severe asymmetrical left ventricular hypertrophy without LVOT gradient and no significant valvular abnormalities.  Renal artery duplex showed no evidence of renal artery stenosis. Blood pressure is more controlled after adjusting her medications.  She reports worsening lower extremity edema, abdominal swelling and swelling in her back below the shoulder blades. This has been associated with increased dyspnea and orthopnea. This is in spite of losing about 15 pounds since last visit.  She was hospitalized at [facility] for acute on chronic diastolic heart failure. The records are not available. Carvedilol was stopped due to bradycardia. She reports undergoing an echocardiogram, abdominal ultrasound for ascites and lower extremity venous duplex. It appears from the instructions that she developed worsening renal function with diuresis and she was asked to hold Lasix for 3 days. She reports improved symptoms.  [facility] records reviewed on 11/01/2013: Echo showed an EF of 65-70% with severe LVH and grade 1 diastolic dysfunction. LE venous duplex showed no DVT. Liver ultrasound showed no evidence of cirrhosis or abnormal portal veins. BNP was elevated.  TnI was 0.4. She was treated for acute on chronic diastolic heart failure.     Allergies  Allergen Reactions  . Dye Fdc Red [Red Dye]   . Spironolactone Rash     Current Outpatient Prescriptions on File Prior to Visit  Medication Sig Dispense Refill  . Albuterol Sulfate (PROAIR HFA IN) Inhale into the lungs as needed.      Marland Kitchen amLODipine (NORVASC) 5 MG tablet Take 2 tablets (10 mg total) by mouth daily.  30 tablet  6  . aspirin 81 MG tablet Take 81 mg by mouth daily.      Marland Kitchen atorvastatin (LIPITOR) 20 MG tablet Take 20 mg by mouth daily.      . furosemide (LASIX) 20 MG tablet Take 20 mg by mouth 2 (two) times daily.       . insulin glargine (LANTUS) 100 UNIT/ML injection Inject 20 Units into the skin at bedtime.       Marland Kitchen losartan (COZAAR) 100 MG tablet Take 100 mg by mouth daily.      . potassium chloride SA (K-DUR,KLOR-CON) 20 MEQ tablet Take 1 tablet (20 mEq total) by mouth daily.  90 tablet  3  . saxagliptin HCl (ONGLYZA) 2.5 MG TABS tablet Take 2.5 mg by mouth daily.       No current facility-administered medications on file prior to visit.     Past Medical History  Diagnosis Date  . Diabetes mellitus without complication   . Benign tumor of labia majora     removed  . Acute diastolic congestive heart failure   . Acute kidney injury   . Heart murmur   . Hyperlipidemia   . Chronic diastolic heart failure   . Hypertension  Past Surgical History  Procedure Laterality Date  . Hysterectomy    . Vulva surgery       Family History  Problem Relation Age of Onset  . Hypertension Mother   . Hypertension Father      History   Social History  . Marital Status: Married    Spouse Name: N/A    Number of Children: N/A  . Years of Education: N/A   Occupational History  . Not on file.   Social History Main Topics  . Smoking status: Never Smoker   . Smokeless tobacco: Not on file  . Alcohol Use: No  . Drug Use: No  . Sexual Activity: Not on file   Other Topics  Concern  . Not on file   Social History Narrative  . No narrative on file     PHYSICAL EXAM   BP 148/76  Pulse 64  Ht 5\' 2"  (1.575 m)  Wt 173 lb 12 oz (78.812 kg)  BMI 31.77 kg/m2 Constitutional: She is oriented to person, place, and time. She appears well-developed and well-nourished. No distress.  HENT: No nasal discharge.  Head: Normocephalic and atraumatic.  Eyes: Pupils are equal and round. Right eye exhibits no discharge. Left eye exhibits no discharge.  Neck: Normal range of motion. Neck supple. No JVD present. No thyromegaly present.  Cardiovascular: Normal rate, regular rhythm, normal heart sounds. Exam reveals no gallop and no friction rub. No murmur heard.  Pulmonary/Chest: Effort normal and breath sounds normal. No stridor. No respiratory distress. She has no wheezes. She has no rales. She exhibits no tenderness.  Abdominal: Soft. Bowel sounds are normal. She exhibits no distension. There is no tenderness. There is no rebound and no guarding.  Musculoskeletal: Normal range of motion. She exhibits +1 edema and no tenderness.  Neurological: She is alert and oriented to person, place, and time. Coordination normal.  Skin: Skin is warm and dry. No rash noted. She is not diaphoretic. No erythema. No pallor.  Psychiatric: She has a normal mood and affect. Her behavior is normal. Judgment and thought content normal.    EKG: Sinus  Rhythm  -Intraventricular conduction defect and left axis -possible anterior fascicular block   consider ventricular hypertrophy.   -Nonspecific ST depression   +   T-abnormality  -Seen with left ventricular hypertrophy (strain)  consider  Lateral ischemia.   ABNORMAL    ASSESSMENT AND PLAN

## 2013-10-19 NOTE — Patient Instructions (Signed)
Your physician recommends that you schedule a follow-up appointment in: 1 month  Your physician recommends that you have lab work today:  Basic metabolic panel   Your physician recommends that you continue on your current medications as directed. Please refer to the Current Medication list given to you today.  We will obtain your records from [facility].

## 2013-10-19 NOTE — Telephone Encounter (Signed)
Needs pt last ejection fraction and date, states if you call and she is on the other line, it is a confidential vm , and you can leave a message. Please call

## 2013-10-20 LAB — BASIC METABOLIC PANEL
BUN/Creatinine Ratio: 14 (ref 11–26)
BUN: 17 mg/dL (ref 8–27)
CALCIUM: 9.8 mg/dL (ref 8.6–10.2)
CO2: 27 mmol/L (ref 18–29)
Chloride: 99 mmol/L (ref 97–108)
Creatinine, Ser: 1.21 mg/dL — ABNORMAL HIGH (ref 0.57–1.00)
GFR calc Af Amer: 51 mL/min/{1.73_m2} — ABNORMAL LOW (ref >59)
GFR calc non Af Amer: 44 mL/min/{1.73_m2} — ABNORMAL LOW (ref >59)
Glucose: 150 mg/dL — ABNORMAL HIGH (ref 65–99)
POTASSIUM: 3.5 mmol/L (ref 3.5–5.2)
Sodium: 144 mmol/L (ref 134–144)

## 2013-10-20 NOTE — Telephone Encounter (Signed)
Left detailed, confidential message on Chelsea Davis's voicemail w/ pt's last EF from echo on 03/19/13. Asked her to call back w/ any other questions or concerns.

## 2013-10-21 DIAGNOSIS — M549 Dorsalgia, unspecified: Secondary | ICD-10-CM | POA: Insufficient documentation

## 2013-10-21 DIAGNOSIS — R778 Other specified abnormalities of plasma proteins: Secondary | ICD-10-CM | POA: Insufficient documentation

## 2013-10-21 DIAGNOSIS — R7989 Other specified abnormal findings of blood chemistry: Secondary | ICD-10-CM

## 2013-10-27 ENCOUNTER — Emergency Department: Payer: Self-pay | Admitting: Emergency Medicine

## 2013-10-27 LAB — BASIC METABOLIC PANEL
ANION GAP: 7 (ref 7–16)
BUN: 16 mg/dL (ref 7–18)
CO2: 32 mmol/L (ref 21–32)
Calcium, Total: 10 mg/dL (ref 8.5–10.1)
Chloride: 100 mmol/L (ref 98–107)
Creatinine: 1.33 mg/dL — ABNORMAL HIGH (ref 0.60–1.30)
GFR CALC AF AMER: 45 — AB
GFR CALC NON AF AMER: 39 — AB
Glucose: 199 mg/dL — ABNORMAL HIGH (ref 65–99)
Osmolality: 284 (ref 275–301)
Potassium: 3 mmol/L — ABNORMAL LOW (ref 3.5–5.1)
Sodium: 139 mmol/L (ref 136–145)

## 2013-10-27 LAB — PRO B NATRIURETIC PEPTIDE: B-Type Natriuretic Peptide: 1107 pg/mL — ABNORMAL HIGH (ref 0–450)

## 2013-10-27 LAB — CBC
HCT: 49.4 % — ABNORMAL HIGH (ref 35.0–47.0)
HGB: 16.4 g/dL — ABNORMAL HIGH (ref 12.0–16.0)
MCH: 31 pg (ref 26.0–34.0)
MCHC: 33.1 g/dL (ref 32.0–36.0)
MCV: 94 fL (ref 80–100)
Platelet: 265 10*3/uL (ref 150–440)
RBC: 5.28 10*6/uL — AB (ref 3.80–5.20)
RDW: 14.6 % — ABNORMAL HIGH (ref 11.5–14.5)
WBC: 9.9 10*3/uL (ref 3.6–11.0)

## 2013-10-27 LAB — TROPONIN I
TROPONIN-I: 0.13 ng/mL — AB
TROPONIN-I: 0.13 ng/mL — AB

## 2013-10-30 ENCOUNTER — Encounter: Payer: Self-pay | Admitting: Cardiovascular Disease

## 2013-10-30 NOTE — Assessment & Plan Note (Signed)
She appears to be euvolemic. She had recent hospitalization at [facility]. The records are not available. These will be requested. Check basic metabolic profile today and continue current medications.

## 2013-10-30 NOTE — Assessment & Plan Note (Signed)
Blood pressure is reasonably controlled on current medications. Carvedilol was stopped recently due to bradycardia.

## 2013-11-01 ENCOUNTER — Telehealth: Payer: Self-pay | Admitting: *Deleted

## 2013-11-01 NOTE — Telephone Encounter (Signed)
Patient stated she was in the hospital last week with shortness breath, elevated BP and bradycardia. She said she has been taking her lasix and other meds as ordered. She "thinks" that she has been following a low sodium diet but has not been eating much at all because she does not feel well. She stated that she is having shortness of breath at night, swelling in the abdomen and feet and ankle. She is unsure if she has gained any weight. She says abdomen feels tight and swollen.  She denies chest pain or shortness of breath when standing.

## 2013-11-01 NOTE — Telephone Encounter (Signed)
Patient called and she swollen in abdomen and feet. Please advise

## 2013-11-01 NOTE — Telephone Encounter (Signed)
Called patient to inform her that per Dr. Fletcher Anon "I reviewed her records from [facility]. The swelling and edema is not all related to her heart. She is already on a diuretic. Increasing the dose might worsen her kidney function. She needs to follow up with her PCP to look for other causes of swelling and edema." Patient verbalized understanding and said she would call her pcp.

## 2013-11-01 NOTE — Telephone Encounter (Signed)
I reviewed her records from [facility]. The swelling and edema is not all related to her heart. She is already on a diuretic. Increasing the dose might worsen her kidney function. She needs to follow up with her PCP to look for other causes of swelling and edema.

## 2013-11-08 ENCOUNTER — Emergency Department: Payer: Self-pay | Admitting: Emergency Medicine

## 2013-11-08 LAB — CBC
HCT: 51.7 % — AB (ref 35.0–47.0)
HGB: 17.1 g/dL — ABNORMAL HIGH (ref 12.0–16.0)
MCH: 30.9 pg (ref 26.0–34.0)
MCHC: 33 g/dL (ref 32.0–36.0)
MCV: 94 fL (ref 80–100)
PLATELETS: 212 10*3/uL (ref 150–440)
RBC: 5.52 10*6/uL — AB (ref 3.80–5.20)
RDW: 14.9 % — ABNORMAL HIGH (ref 11.5–14.5)
WBC: 9.8 10*3/uL (ref 3.6–11.0)

## 2013-11-08 LAB — BASIC METABOLIC PANEL
Anion Gap: 7 (ref 7–16)
BUN: 19 mg/dL — ABNORMAL HIGH (ref 7–18)
CALCIUM: 9.5 mg/dL (ref 8.5–10.1)
CHLORIDE: 96 mmol/L — AB (ref 98–107)
CREATININE: 1.5 mg/dL — AB (ref 0.60–1.30)
Co2: 30 mmol/L (ref 21–32)
EGFR (African American): 39 — ABNORMAL LOW
GFR CALC NON AF AMER: 34 — AB
GLUCOSE: 214 mg/dL — AB (ref 65–99)
Osmolality: 275 (ref 275–301)
POTASSIUM: 2.9 mmol/L — AB (ref 3.5–5.1)
SODIUM: 133 mmol/L — AB (ref 136–145)

## 2013-11-08 LAB — TROPONIN I: TROPONIN-I: 0.1 ng/mL — AB

## 2013-11-16 DIAGNOSIS — K3189 Other diseases of stomach and duodenum: Secondary | ICD-10-CM | POA: Insufficient documentation

## 2013-11-16 DIAGNOSIS — R1013 Epigastric pain: Secondary | ICD-10-CM | POA: Insufficient documentation

## 2013-11-22 ENCOUNTER — Emergency Department: Payer: Self-pay | Admitting: Emergency Medicine

## 2013-11-22 LAB — CBC WITH DIFFERENTIAL/PLATELET
Basophil #: 0.1 10*3/uL (ref 0.0–0.1)
Basophil %: 1.1 %
Eosinophil #: 0 10*3/uL (ref 0.0–0.7)
Eosinophil %: 0.5 %
HCT: 49.5 % — AB (ref 35.0–47.0)
HGB: 16.5 g/dL — ABNORMAL HIGH (ref 12.0–16.0)
LYMPHS PCT: 28.8 %
Lymphocyte #: 2.2 10*3/uL (ref 1.0–3.6)
MCH: 31.1 pg (ref 26.0–34.0)
MCHC: 33.4 g/dL (ref 32.0–36.0)
MCV: 93 fL (ref 80–100)
MONOS PCT: 8.6 %
Monocyte #: 0.7 x10 3/mm (ref 0.2–0.9)
Neutrophil #: 4.8 10*3/uL (ref 1.4–6.5)
Neutrophil %: 61 %
PLATELETS: 259 10*3/uL (ref 150–440)
RBC: 5.32 10*6/uL — AB (ref 3.80–5.20)
RDW: 15.1 % — ABNORMAL HIGH (ref 11.5–14.5)
WBC: 7.8 10*3/uL (ref 3.6–11.0)

## 2013-11-22 LAB — COMPREHENSIVE METABOLIC PANEL
ALT: 30 U/L (ref 12–78)
Albumin: 3.3 g/dL — ABNORMAL LOW (ref 3.4–5.0)
Alkaline Phosphatase: 83 U/L
Anion Gap: 3 — ABNORMAL LOW (ref 7–16)
BUN: 18 mg/dL (ref 7–18)
Bilirubin,Total: 1.3 mg/dL — ABNORMAL HIGH (ref 0.2–1.0)
CALCIUM: 9.9 mg/dL (ref 8.5–10.1)
Chloride: 98 mmol/L (ref 98–107)
Co2: 37 mmol/L — ABNORMAL HIGH (ref 21–32)
Creatinine: 1.37 mg/dL — ABNORMAL HIGH (ref 0.60–1.30)
EGFR (African American): 44 — ABNORMAL LOW
GFR CALC NON AF AMER: 38 — AB
GLUCOSE: 107 mg/dL — AB (ref 65–99)
Osmolality: 278 (ref 275–301)
Potassium: 3 mmol/L — ABNORMAL LOW (ref 3.5–5.1)
SGOT(AST): 33 U/L (ref 15–37)
SODIUM: 138 mmol/L (ref 136–145)
Total Protein: 7.4 g/dL (ref 6.4–8.2)

## 2013-11-22 LAB — URINALYSIS, COMPLETE
Bacteria: NONE SEEN
Bilirubin,UR: NEGATIVE
GLUCOSE, UR: NEGATIVE mg/dL (ref 0–75)
Hyaline Cast: 41
Ketone: NEGATIVE
Leukocyte Esterase: NEGATIVE
NITRITE: NEGATIVE
Ph: 5 (ref 4.5–8.0)
RBC,UR: <1 /HPF (ref 0–5)
Specific Gravity: 1.016 (ref 1.003–1.030)
Squamous Epithelial: 5
WBC UR: 1 /HPF (ref 0–5)

## 2013-11-23 ENCOUNTER — Ambulatory Visit: Payer: Medicare Other | Admitting: Cardiovascular Disease

## 2013-11-29 ENCOUNTER — Emergency Department: Payer: Self-pay | Admitting: Emergency Medicine

## 2013-11-29 LAB — URINALYSIS, COMPLETE
BILIRUBIN, UR: NEGATIVE
Bacteria: NONE SEEN
Blood: NEGATIVE
Glucose,UR: 50 mg/dL (ref 0–75)
Hyaline Cast: 1
LEUKOCYTE ESTERASE: NEGATIVE
Nitrite: NEGATIVE
Ph: 7 (ref 4.5–8.0)
Protein: NEGATIVE
Specific Gravity: 1.006 (ref 1.003–1.030)

## 2013-11-29 LAB — COMPREHENSIVE METABOLIC PANEL
ALBUMIN: 3.1 g/dL — AB (ref 3.4–5.0)
Alkaline Phosphatase: 82 U/L
Anion Gap: 7 (ref 7–16)
BUN: 16 mg/dL (ref 7–18)
Bilirubin,Total: 1 mg/dL (ref 0.2–1.0)
CHLORIDE: 98 mmol/L (ref 98–107)
Calcium, Total: 8.9 mg/dL (ref 8.5–10.1)
Co2: 33 mmol/L — ABNORMAL HIGH (ref 21–32)
Creatinine: 1.24 mg/dL (ref 0.60–1.30)
EGFR (Non-African Amer.): 42 — ABNORMAL LOW
GFR CALC AF AMER: 49 — AB
GLUCOSE: 150 mg/dL — AB (ref 65–99)
Osmolality: 280 (ref 275–301)
POTASSIUM: 2.7 mmol/L — AB (ref 3.5–5.1)
SGOT(AST): 24 U/L (ref 15–37)
SGPT (ALT): 23 U/L (ref 12–78)
Sodium: 138 mmol/L (ref 136–145)
Total Protein: 7.2 g/dL (ref 6.4–8.2)

## 2013-11-29 LAB — CBC
HCT: 47.4 % — AB (ref 35.0–47.0)
HGB: 16.1 g/dL — ABNORMAL HIGH (ref 12.0–16.0)
MCH: 31.9 pg (ref 26.0–34.0)
MCHC: 34.1 g/dL (ref 32.0–36.0)
MCV: 94 fL (ref 80–100)
Platelet: 255 10*3/uL (ref 150–440)
RBC: 5.06 10*6/uL (ref 3.80–5.20)
RDW: 14.8 % — AB (ref 11.5–14.5)
WBC: 8.4 10*3/uL (ref 3.6–11.0)

## 2013-11-29 LAB — LIPASE, BLOOD: Lipase: 52 U/L — ABNORMAL LOW (ref 73–393)

## 2013-11-30 ENCOUNTER — Ambulatory Visit: Payer: Medicare Other | Admitting: Cardiovascular Disease

## 2013-12-02 ENCOUNTER — Ambulatory Visit: Payer: Medicare Other | Admitting: Cardiovascular Disease

## 2013-12-04 ENCOUNTER — Emergency Department: Payer: Self-pay | Admitting: Emergency Medicine

## 2013-12-14 ENCOUNTER — Ambulatory Visit: Payer: Medicare Other | Admitting: Cardiovascular Disease

## 2013-12-16 ENCOUNTER — Ambulatory Visit (INDEPENDENT_AMBULATORY_CARE_PROVIDER_SITE_OTHER): Payer: Medicare Other | Admitting: Cardiovascular Disease

## 2013-12-16 ENCOUNTER — Encounter: Payer: Self-pay | Admitting: Cardiovascular Disease

## 2013-12-16 VITALS — BP 170/92 | HR 85 | Ht 61.0 in | Wt 153.5 lb

## 2013-12-16 DIAGNOSIS — I5032 Chronic diastolic (congestive) heart failure: Secondary | ICD-10-CM

## 2013-12-16 DIAGNOSIS — K5900 Constipation, unspecified: Secondary | ICD-10-CM | POA: Insufficient documentation

## 2013-12-16 DIAGNOSIS — I10 Essential (primary) hypertension: Secondary | ICD-10-CM

## 2013-12-16 MED ORDER — FUROSEMIDE 20 MG PO TABS: 20.0000 mg | ORAL_TABLET | Freq: Every day | ORAL | Status: DC

## 2013-12-16 NOTE — Patient Instructions (Signed)
Your physician recommends that you have lab work today: Basic Metabolic Panel   Your physician has recommended you make the following change in your medication:  Decrease Lasix to 20 mg once daily   Your physician wants you to follow-up in: 6 months. You will receive a reminder letter in the mail two months in advance. If you don't receive a letter, please call our office to schedule the follow-up appointment.

## 2013-12-16 NOTE — Progress Notes (Signed)
Primary care physician: Dr. Rosario Jacks.   HPI  This is a 78 year old African American female who is here today for a followup visit regarding chronic diastolic heart failure and refractory hypertension. She was hospitalized in June of 2014 due to worsening dyspnea and 20 pounds weight gain with lower extremity edema and swelling in her back. She was hypertensive on presentation with blood pressure of 206/78.  BNP was greater than 800. She improved quickly with IV Lasix. Cardiac enzymes were mildly elevated. She ran out of her blood pressure medications few weeks before presentation. She underwent a nuclear stress test with Dr. Rosario Jacks which was negative for ischemia at submaximal work load. Echocardiogram while hospitalized showed hyperdynamic LV systolic function with severe asymmetrical left ventricular hypertrophy without LVOT gradient and no significant valvular abnormalities.  Renal artery duplex showed no evidence of renal artery stenosis.  She was hospitalized at [facility] in 10/2013 for acute on chronic diastolic heart failure.  Carvedilol was stopped due to bradycardia. [facility] records reviewed : Echo showed an EF of 65-70% with severe LVH and grade 1 diastolic dysfunction. LE venous duplex showed no DVT. Liver ultrasound showed no evidence of cirrhosis or abnormal portal veins. BNP was elevated. TnI was 0.4. She was treated for acute on chronic diastolic heart failure but kidney function worsened with diuresis.  She reports recent constipation and early satiety. She lost more than 20 pounds since last visit due to that. She was hospitalized briefly at [facility] for constipation and was given an enema. She informs me that she is scheduled for an EGD and colonoscopy in April. She did not take her blood pressure medications today.    Allergies  Allergen Reactions  . Dye Fdc Red [Red Dye]   . Spironolactone Rash     Current Outpatient Prescriptions on File Prior to Visit  Medication Sig Dispense Refill   . Albuterol Sulfate (PROAIR HFA IN) Inhale into the lungs as needed.      Marland Kitchen amLODipine (NORVASC) 5 MG tablet Take 2 tablets (10 mg total) by mouth daily.  30 tablet  6  . aspirin 81 MG tablet Take 81 mg by mouth daily.      Marland Kitchen atorvastatin (LIPITOR) 20 MG tablet Take 20 mg by mouth daily.      Marland Kitchen losartan (COZAAR) 100 MG tablet Take 100 mg by mouth daily.      . potassium chloride SA (K-DUR,KLOR-CON) 20 MEQ tablet Take 1 tablet (20 mEq total) by mouth daily.  90 tablet  3  . saxagliptin HCl (ONGLYZA) 2.5 MG TABS tablet Take 2.5 mg by mouth daily.      . insulin glargine (LANTUS) 100 UNIT/ML injection Inject 20 Units into the skin at bedtime.        No current facility-administered medications on file prior to visit.     Past Medical History  Diagnosis Date  . Diabetes mellitus without complication   . Benign tumor of labia majora     removed  . Acute diastolic congestive heart failure   . Acute kidney injury   . Heart murmur   . Hyperlipidemia   . Chronic diastolic heart failure   . Hypertension      Past Surgical History  Procedure Laterality Date  . Hysterectomy    . Vulva surgery       Family History  Problem Relation Age of Onset  . Hypertension Mother   . Hypertension Father      History   Social History  .  Marital Status: Married    Spouse Name: N/A    Number of Children: N/A  . Years of Education: N/A   Occupational History  . Not on file.   Social History Main Topics  . Smoking status: Never Smoker   . Smokeless tobacco: Not on file  . Alcohol Use: No  . Drug Use: No  . Sexual Activity: Not on file   Other Topics Concern  . Not on file   Social History Narrative  . No narrative on file     PHYSICAL EXAM   BP 170/92  Pulse 85  Ht 5\' 1"  (1.549 m)  Wt 153 lb 8 oz (69.627 kg)  BMI 29.02 kg/m2 Constitutional: She is oriented to person, place, and time. She appears well-developed and well-nourished. No distress.  HENT: No nasal discharge.    Head: Normocephalic and atraumatic.  Eyes: Pupils are equal and round. Right eye exhibits no discharge. Left eye exhibits no discharge.  Neck: Normal range of motion. Neck supple. No JVD present. No thyromegaly present.  Cardiovascular: Normal rate, regular rhythm, normal heart sounds. Exam reveals no gallop and no friction rub. No murmur heard.  Pulmonary/Chest: Effort normal and breath sounds normal. No stridor. No respiratory distress. She has no wheezes. She has no rales. She exhibits no tenderness.  Abdominal: Soft. Bowel sounds are normal. She exhibits no distension. There is no tenderness. There is no rebound and no guarding.  Musculoskeletal: Normal range of motion. She exhibits no edema and no tenderness.  Neurological: She is alert and oriented to person, place, and time. Coordination normal.  Skin: Skin is warm and dry. No rash noted. She is not diaphoretic. No erythema. No pallor.  Psychiatric: She has a normal mood and affect. Her behavior is normal. Judgment and thought content normal.      ASSESSMENT AND PLAN

## 2013-12-16 NOTE — Assessment & Plan Note (Signed)
I asked her to take her blood pressure medications regularly.

## 2013-12-16 NOTE — Assessment & Plan Note (Signed)
The patient seems to be volume depleted. This might be related to poor oral intake. Check basic metabolic profile today. Decrease Lasix to 20 mg once daily. Further adjustment to follow after reviewing her labs. Blood pressure is elevated today she did not take her blood pressure medications.

## 2013-12-16 NOTE — Assessment & Plan Note (Signed)
This has been associated with early satiety and weight loss which is concerning for possible underlying malignancy. I agree with EGD and colonoscopy which according to the patient is scheduled in April.

## 2013-12-17 ENCOUNTER — Telehealth: Payer: Self-pay | Admitting: *Deleted

## 2013-12-17 DIAGNOSIS — I5032 Chronic diastolic (congestive) heart failure: Secondary | ICD-10-CM

## 2013-12-17 LAB — BASIC METABOLIC PANEL
BUN / CREAT RATIO: 15 (ref 11–26)
BUN: 21 mg/dL (ref 8–27)
CALCIUM: 9.3 mg/dL (ref 8.7–10.3)
CO2: 24 mmol/L (ref 18–29)
Chloride: 93 mmol/L — ABNORMAL LOW (ref 97–108)
Creatinine, Ser: 1.38 mg/dL — ABNORMAL HIGH (ref 0.57–1.00)
GFR calc Af Amer: 43 mL/min/{1.73_m2} — ABNORMAL LOW (ref >59)
GFR, EST NON AFRICAN AMERICAN: 37 mL/min/{1.73_m2} — AB (ref >59)
Glucose: 137 mg/dL — ABNORMAL HIGH (ref 65–99)
Potassium: 3.2 mmol/L — ABNORMAL LOW (ref 3.5–5.2)
SODIUM: 141 mmol/L (ref 134–144)

## 2013-12-17 NOTE — Telephone Encounter (Signed)
Message copied by Tracie Harrier on Fri Dec 17, 2013  8:19 AM ------      Message from: Kathlyn Sacramento A      Created: Fri Dec 17, 2013  6:49 AM       She is mildly dehydrated with low K. We already decreased Lasix to once daily. Continue same dose of Potasium.       Repeat BMP in 2 weeks. ------

## 2013-12-24 ENCOUNTER — Telehealth: Payer: Self-pay

## 2013-12-24 NOTE — Telephone Encounter (Signed)
Pt daughter called and states pt is having labs done at PCP on Tuesday, would like to know what labs she is scheduled to have here on Monday, so she can have them done all together on Tuesday. Please call.

## 2013-12-24 NOTE — Telephone Encounter (Signed)
LVM 3/20

## 2013-12-24 NOTE — Telephone Encounter (Signed)
Attempted to return VM. Line was busy. 3/20 1620

## 2013-12-27 ENCOUNTER — Other Ambulatory Visit: Payer: Medicare Other

## 2013-12-27 NOTE — Telephone Encounter (Signed)
Informed patients daughter that she needed a BMP.

## 2013-12-29 ENCOUNTER — Telehealth: Payer: Self-pay

## 2013-12-29 NOTE — Telephone Encounter (Signed)
Chelsea Davis stated she thinks her PCP has placed her on potassium tablets and wants to know if she should take that on the potasium we currently have her on.  I instructed her not to take it until she contacted the PCP that placed her on it to confirm.  Patient verbalized understanding.

## 2013-12-29 NOTE — Telephone Encounter (Signed)
Pt called and states that [place] is going to do all her blood work. They are supposed to be sending it. Pt would like a nurse to call, has questions about her medicines.

## 2013-12-30 ENCOUNTER — Other Ambulatory Visit: Payer: Medicare Other

## 2014-01-19 ENCOUNTER — Telehealth: Payer: Self-pay | Admitting: *Deleted

## 2014-01-19 NOTE — Telephone Encounter (Signed)
Called patient to reschedule lab appt she missed on 12/30/13 She stated her BMP was drawn by her PCP in [facility] and her potassium has been addressed  I asked her to have a copy of BMP sent to [place]  Patient verbalized understanding

## 2014-01-27 ENCOUNTER — Telehealth: Payer: Self-pay | Admitting: *Deleted

## 2014-01-27 NOTE — Telephone Encounter (Signed)
Error

## 2014-02-01 DIAGNOSIS — R140 Abdominal distension (gaseous): Secondary | ICD-10-CM | POA: Insufficient documentation

## 2014-02-01 DIAGNOSIS — R142 Eructation: Secondary | ICD-10-CM

## 2014-02-01 DIAGNOSIS — R141 Gas pain: Secondary | ICD-10-CM | POA: Insufficient documentation

## 2014-02-01 DIAGNOSIS — R143 Flatulence: Secondary | ICD-10-CM

## 2014-03-05 ENCOUNTER — Telehealth: Payer: Self-pay | Admitting: Physician Assistant

## 2014-03-05 NOTE — Telephone Encounter (Signed)
    Patient called because she is feeling SOB. She has 3 pillow orthopnea (usually 2), PND and some LEE. She denies any weight gain. She takes Lasix 20mg  daily. I advised her to take one extra and if she feels better to make an appointment in the office on Monday. I also advised her to come to the ED if she has any worsening sx or CP or pre-syncope. She admits to recent increased salt intake. She will be complaint with salt restrictions moving forward.   Perry Mount PA-C  [facility]

## 2014-03-07 ENCOUNTER — Ambulatory Visit (INDEPENDENT_AMBULATORY_CARE_PROVIDER_SITE_OTHER): Payer: Medicare Other | Admitting: Cardiovascular Disease

## 2014-03-07 ENCOUNTER — Encounter: Payer: Self-pay | Admitting: Cardiovascular Disease

## 2014-03-07 VITALS — BP 150/62 | HR 63 | Ht 65.0 in | Wt 151.5 lb

## 2014-03-07 DIAGNOSIS — I5032 Chronic diastolic (congestive) heart failure: Secondary | ICD-10-CM

## 2014-03-07 DIAGNOSIS — I10 Essential (primary) hypertension: Secondary | ICD-10-CM

## 2014-03-07 DIAGNOSIS — E1129 Type 2 diabetes mellitus with other diabetic kidney complication: Secondary | ICD-10-CM | POA: Insufficient documentation

## 2014-03-07 DIAGNOSIS — E119 Type 2 diabetes mellitus without complications: Secondary | ICD-10-CM

## 2014-03-07 MED ORDER — FUROSEMIDE 20 MG PO TABS
ORAL_TABLET | ORAL | Status: DC
Start: 2014-03-07 — End: 2014-05-09

## 2014-03-07 NOTE — Assessment & Plan Note (Signed)
She was educated about insulin injections today.

## 2014-03-07 NOTE — Assessment & Plan Note (Signed)
Blood pressure is mildly elevated today. Carvedilol was discontinued in the past due to bradycardia. Continue to monitor. I might consider hydralazine in the future.

## 2014-03-07 NOTE — Patient Instructions (Addendum)
Your physician recommends that you schedule a follow-up appointment in:  3 months   Follow low sodium diet   Follow diabetic diet   Call with any questions about our education today   Your physician has recommended you make the following change in your medication:  Take 1 tablet 20 mg of lasix daily  Take 1 additional tablet of lasix one time for weight gain of 3 lb in 48 hrs

## 2014-03-07 NOTE — Assessment & Plan Note (Signed)
The patient was recently fluid overloaded and improved with an additional dose Lasix. A significant portion of today's office visit was dedicated for education regarding low-sodium diet.  Higher doses of Lasix in the past was associated with worsening kidney function. Thus, I changed the dose back to 20 mg once daily with instructions to take an additional dose if she gains more than 3 pounds in 48 hours.

## 2014-03-07 NOTE — Progress Notes (Signed)
HPI  This is a 78 year old African American female who is here today for a followup visit regarding chronic diastolic heart failure and refractory hypertension. She was hospitalized in June of 2014 due to worsening dyspnea and 20 pounds weight gain with lower extremity edema and swelling in her back. She was hypertensive on presentation with blood pressure of 206/78.  BNP was greater than 800. She improved quickly with IV Lasix. Cardiac enzymes were mildly elevated. She ran out of her blood pressure medications few weeks before presentation. She underwent a nuclear stress test with Dr. Rosario Jacks which was negative for ischemia at submaximal work load. Echocardiogram while hospitalized showed hyperdynamic LV systolic function with severe asymmetrical left ventricular hypertrophy without LVOT gradient and no significant valvular abnormalities.  Renal artery duplex showed no evidence of renal artery stenosis.  She was hospitalized at [facility] in 10/2013 for acute on chronic diastolic heart failure.  Carvedilol was stopped due to bradycardia. [facility] records reviewed : Echo showed an EF of 65-70% with severe LVH and grade 1 diastolic dysfunction. LE venous duplex showed no DVT. Liver ultrasound showed no evidence of cirrhosis or abnormal portal veins. BNP was elevated. TnI was 0.4. She was treated for acute on chronic diastolic heart failure but kidney function worsened with diuresis.  She is not compliant with low sodium diet and that has been an issue. She reports recent worsening of leg edema and dyspnea with 5 pounds weight gain. She is enrolled in the Medicare advantage heart failure program. She was told to increased Lasix to 20 mg twice daily. Weight is back to baseline.    Allergies  Allergen Reactions  . Dye Fdc Red [Red Dye]   . Spironolactone Rash     Current Outpatient Prescriptions on File Prior to Visit  Medication Sig Dispense Refill  . Albuterol Sulfate (PROAIR HFA IN) Inhale into the lungs  as needed.      Marland Kitchen amLODipine (NORVASC) 5 MG tablet Take 2 tablets (10 mg total) by mouth daily.  30 tablet  6  . aspirin 81 MG tablet Take 81 mg by mouth daily.      Marland Kitchen atorvastatin (LIPITOR) 20 MG tablet Take 20 mg by mouth daily.      . furosemide (LASIX) 20 MG tablet Take 1 tablet (20 mg total) by mouth daily.  30 tablet  3  . insulin glargine (LANTUS) 100 UNIT/ML injection Inject 20 Units into the skin at bedtime.       Marland Kitchen losartan (COZAAR) 100 MG tablet Take 100 mg by mouth daily.       No current facility-administered medications on file prior to visit.     Past Medical History  Diagnosis Date  . Diabetes mellitus without complication   . Benign tumor of labia majora     removed  . Acute diastolic congestive heart failure   . Acute kidney injury   . Heart murmur   . Hyperlipidemia   . Chronic diastolic heart failure   . Hypertension      Past Surgical History  Procedure Laterality Date  . Hysterectomy    . Vulva surgery       Family History  Problem Relation Age of Onset  . Hypertension Mother   . Hypertension Father      History   Social History  . Marital Status: Married    Spouse Name: N/A    Number of Children: N/A  . Years of Education: N/A   Occupational History  .  Not on file.   Social History Main Topics  . Smoking status: Never Smoker   . Smokeless tobacco: Not on file  . Alcohol Use: No  . Drug Use: No  . Sexual Activity: Not on file   Other Topics Concern  . Not on file   Social History Narrative  . No narrative on file     PHYSICAL EXAM   BP 150/62  Pulse 63  Ht 5\' 5"  (1.651 m)  Wt 151 lb 8 oz (68.72 kg)  BMI 25.21 kg/m2 Constitutional: She is oriented to person, place, and time. She appears well-developed and well-nourished. No distress.  HENT: No nasal discharge.  Head: Normocephalic and atraumatic.  Eyes: Pupils are equal and round. Right eye exhibits no discharge. Left eye exhibits no discharge.  Neck: Normal range of  motion. Neck supple. No JVD present. No thyromegaly present.  Cardiovascular: Normal rate, regular rhythm, normal heart sounds. Exam reveals no gallop and no friction rub. No murmur heard.  Pulmonary/Chest: Effort normal and breath sounds normal. No stridor. No respiratory distress. She has no wheezes. She has no rales. She exhibits no tenderness.  Abdominal: Soft. Bowel sounds are normal. She exhibits no distension. There is no tenderness. There is no rebound and no guarding.  Musculoskeletal: Normal range of motion. She exhibits no edema and no tenderness.  Neurological: She is alert and oriented to person, place, and time. Coordination normal.  Skin: Skin is warm and dry. No rash noted. She is not diaphoretic. No erythema. No pallor.  Psychiatric: She has a normal mood and affect. Her behavior is normal. Judgment and thought content normal.      ASSESSMENT AND PLAN

## 2014-04-05 DIAGNOSIS — Z0000 Encounter for general adult medical examination without abnormal findings: Secondary | ICD-10-CM | POA: Insufficient documentation

## 2014-05-07 ENCOUNTER — Telehealth: Payer: Self-pay | Admitting: Nurse Practitioner

## 2014-05-07 NOTE — Telephone Encounter (Signed)
Pt called to report wt gain and lower ext edema.  She had prev been on lasix 20mg  daily but her PCP @ [facility] increased this to 20mg  BID this past Tuesday.  Her wt, which was ~ 152 on 6/1, is now 160.  She does have DOE.  I rec that she increase lasix to 40 bid for the next three days and that I will contact our [place] office to arrange for early f/u on either Monday or Tuesday with either me or Dr. Fletcher Anon.  We can check a bmet @ that time.  She verbalized understanding.

## 2014-05-09 ENCOUNTER — Ambulatory Visit (INDEPENDENT_AMBULATORY_CARE_PROVIDER_SITE_OTHER): Payer: Medicare Other | Admitting: Nurse Practitioner

## 2014-05-09 ENCOUNTER — Encounter: Payer: Self-pay | Admitting: Nurse Practitioner

## 2014-05-09 ENCOUNTER — Telehealth: Payer: Self-pay | Admitting: *Deleted

## 2014-05-09 VITALS — BP 140/60 | HR 57 | Ht 60.0 in | Wt 159.2 lb

## 2014-05-09 DIAGNOSIS — N183 Chronic kidney disease, stage 3 unspecified: Secondary | ICD-10-CM

## 2014-05-09 DIAGNOSIS — R0602 Shortness of breath: Secondary | ICD-10-CM

## 2014-05-09 DIAGNOSIS — I5033 Acute on chronic diastolic (congestive) heart failure: Secondary | ICD-10-CM

## 2014-05-09 DIAGNOSIS — I10 Essential (primary) hypertension: Secondary | ICD-10-CM

## 2014-05-09 DIAGNOSIS — I509 Heart failure, unspecified: Secondary | ICD-10-CM

## 2014-05-09 NOTE — Patient Instructions (Addendum)
Your physician recommends that you have labs today: BMP  Your physician has recommended you make the following change in your medication:  Today and tomorrow please take Lasix 40 mg (2 tablets) in the morning and at night Then go back to your normal dose 20 mg (1 tablet) in the morning and at night   Your physician recommends that you schedule a follow-up appointment in:  1 week with Ignacia Bayley NP

## 2014-05-09 NOTE — Telephone Encounter (Signed)
Patient called and she is very swollen in the legs, feet so bad she cant walk. Also in her abdomen and back.

## 2014-05-09 NOTE — Progress Notes (Signed)
Patient Name: Chelsea Davis Date of Encounter: 05/09/2014  Primary Care Provider:  [facility] Primary Cardiologist:  Jerilynn Mages. Fletcher Anon, MD   Patient Profile  38 old female with a history of diastolic congestive heart failure who presents today saying her to weight gain and edema.  Problem List   Past Medical History  Diagnosis Date  . Diabetes mellitus without complication   . Benign tumor of labia majora     a. resected/[facility].  . Chronic diastolic CHF (congestive heart failure)     a. 03/2013 Neg MV;  b. 10/2013 Echo: EF 65-70%, LVH, diast dysfxn, Triv MR/TR, mildly dil LA.  Marland Kitchen Hyperlipidemia   . Hypertension   . CKD (chronic kidney disease), stage III    Past Surgical History  Procedure Laterality Date  . Hysterectomy    . Vulva surgery      Allergies  Allergies  Allergen Reactions  . Coreg [Carvedilol]     Bradycardia   . Dye Fdc Red [Red Dye]   . Spironolactone Rash    HPI  61 old female with the above problem list.  She has a history of chronic diastolic congestive heart failure as well as stage III chronic kidney disease.  She called over the weekend reporting that her weight is up approximately 8 pounds.  This occurred despite having her Lasix dose increased by her primary care provider from 20 mg daily to 20 mg b.i.d. Last week.  I spoke to her on the phone when she called on Saturday, and recommended that she increase her Lasix to 40 mg b.i.d. For 3 days and that we would arrange for this appointment today.  She did as instructed and took 40 mg of Lasix b.i.d. On Saturday and Sunday.  Her weight came down only 1 pound.  Her lower extremity edema improved though she says her abdomen feels firm.  She also has dyspnea exertion she thinks is somewhat worse than baseline.  She has not had any chest pain.  She denies PND, orthopnea, dizziness, syncope, or early satiety.  She says in fact, her appetite may be too good.  Home Medications  Prior to Admission medications   Medication Sig  Start Date End Date Taking? Authorizing Provider  Albuterol Sulfate (PROAIR HFA IN) Inhale into the lungs as needed.   Yes Historical Provider, MD  amLODipine (NORVASC) 10 MG tablet Take 10 mg by mouth daily.   Yes Historical Provider, MD  aspirin 81 MG tablet Take 81 mg by mouth daily.   Yes Historical Provider, MD  atorvastatin (LIPITOR) 20 MG tablet Take 20 mg by mouth daily.   Yes Historical Provider, MD  furosemide (LASIX) 20 MG tablet Take 20 mg by mouth 2 (two) times daily. 03/07/14  Yes Wellington Hampshire, MD  insulin glargine (LANTUS) 100 UNIT/ML injection Inject 20 Units into the skin at bedtime.    Yes Historical Provider, MD  lactulose (CHRONULAC) 10 GM/15ML solution Take by mouth as needed.    Yes Historical Provider, MD  losartan (COZAAR) 100 MG tablet Take 100 mg by mouth daily.   Yes Historical Provider, MD  methimazole (TAPAZOLE) 5 MG tablet Take 5 mg by mouth daily.   Yes Historical Provider, MD  Potassium Iodide 10 % SOLN by Does not apply route daily.   Yes Historical Provider, MD  saxagliptin HCl (ONGLYZA) 5 MG TABS tablet Take 5 mg by mouth daily.   Yes Historical Provider, MD  triamterene-hydrochlorothiazide (DYAZIDE) 37.5-25 MG per capsule Take 1 capsule by  mouth daily.   Yes Historical Provider, MD    Review of Systems  Weight gain, edema, and dyspnea as above.  All other systems reviewed and are otherwise negative except as noted above.  Physical Exam  Blood pressure 140/60, pulse 57, height 5' (1.524 m), weight 159 lb 4 oz (72.235 kg).  General: Pleasant, NAD Psych: Normal affect. Neuro: Alert and oriented X 3. Moves all extremities spontaneously. HEENT: Normal  Neck: Supple without bruits.  Neck veins are flat. Lungs:  Resp regular and unlabored, CTA. Heart: RRR no s3, s4, or murmurs. Abdomen: Soft, non-tender, non-distended, BS + x 4.  Extremities: No clubbing, cyanosis or edema. DP/PT/Radials 2+ and equal bilaterally.  Accessory Clinical Findings  ECG -  sinus bradycardia, 57, left bundle branch block, left axis deviation, T wave inversion in V1 and aVL.  No acute ST-T changes.  Assessment & Plan  1.  Acute and chronic diastolic congestive heart failure: Patient reports an 8-9 pound weight gain over the past few weeks.  Her outpatient Lasix dose was adjusted by primary care early last week to 20 mg b.i.d.  It was further adjusted over this past weekend to 40 mg b.i.d. With a plan to do this for only 3 days.  Her weight has come down 1 pound after 2 days of additional therapy.  She has no lower extremity edema on exam and her abdomen is soft.  Subjectively, she feels as though her abdomen is firm.  She has no crackles and neck veins are flat.  She does however have some progression of dyspnea on exertion.  We will check a basic metabolic panel today.  I recommended that she continue Lasix 40 g b.i.d. For today and tomorrow only, given weight gain and dyspnea, and then reduced dose back to 20 mg b.i.d.  I will plan to see her back next week to see how she is doing.  Her heart rate is stable.  Blood pressure is mildly elevated but in light of ongoing diuresis, would not change her antihypertensives.  2.  Hypertension: As above, pressures are mildly elevated.  With higher Lasix doses over the next few days, would not change either her amlodipine or losartan dose.  3.  Stage III chronic end disease: She has a history of worsening renal function with higher doses of diuretics.  We'll check a basic metabolic panel today and as above limit her exposure to higher doses to just today and tomorrow.  4.  Disposition: Basic metabolic profile today.  Followup in one week.  Murray Hodgkins, NP 05/09/2014, 3:42 PM

## 2014-05-09 NOTE — Telephone Encounter (Signed)
Patient to see Angelica Ran 8/3

## 2014-05-10 LAB — BASIC METABOLIC PANEL
BUN/Creatinine Ratio: 18 (ref 11–26)
BUN: 20 mg/dL (ref 8–27)
CALCIUM: 9.3 mg/dL (ref 8.7–10.3)
CO2: 26 mmol/L (ref 18–29)
CREATININE: 1.1 mg/dL — AB (ref 0.57–1.00)
Chloride: 98 mmol/L (ref 97–108)
GFR calc Af Amer: 56 mL/min/{1.73_m2} — ABNORMAL LOW (ref >59)
GFR, EST NON AFRICAN AMERICAN: 49 mL/min/{1.73_m2} — AB (ref >59)
GLUCOSE: 190 mg/dL — AB (ref 65–99)
Potassium: 3.8 mmol/L (ref 3.5–5.2)
Sodium: 141 mmol/L (ref 134–144)

## 2014-05-17 ENCOUNTER — Other Ambulatory Visit: Payer: Medicare Other

## 2014-05-17 ENCOUNTER — Ambulatory Visit (INDEPENDENT_AMBULATORY_CARE_PROVIDER_SITE_OTHER): Payer: Medicare Other | Admitting: Nurse Practitioner

## 2014-05-17 ENCOUNTER — Encounter: Payer: Self-pay | Admitting: Nurse Practitioner

## 2014-05-17 VITALS — BP 160/70 | HR 64 | Ht 62.0 in | Wt 159.8 lb

## 2014-05-17 DIAGNOSIS — N183 Chronic kidney disease, stage 3 unspecified: Secondary | ICD-10-CM

## 2014-05-17 DIAGNOSIS — I10 Essential (primary) hypertension: Secondary | ICD-10-CM

## 2014-05-17 DIAGNOSIS — I5033 Acute on chronic diastolic (congestive) heart failure: Secondary | ICD-10-CM

## 2014-05-17 DIAGNOSIS — I509 Heart failure, unspecified: Secondary | ICD-10-CM

## 2014-05-17 NOTE — Patient Instructions (Signed)
Increase Lasix to 40 mg (two tabs) twice a day for 1 week  Then resume your normal dose    Your physician recommends that you schedule a follow-up appointment in:  With the nurse in one week for labs, Blood pressure check, and weight   Your physician recommends that you schedule a follow-up appointment in:  Keep your follow up appt with Dr. Fletcher Anon   Your physician recommends that you return for lab work in:  [facility] in 1 week during your nurse visit

## 2014-05-17 NOTE — Progress Notes (Signed)
Patient Name: Chelsea Davis Date of Encounter: 05/17/2014  Primary Care Provider:  [facility] Primary Cardiologist:  Chelsea Mages. Fletcher Anon, MD   Patient Profile  71 old female with a history of diastolic congestive heart failure who presents today saying her to weight gain and edema.  Problem List   Past Medical History  Diagnosis Date  . Diabetes mellitus without complication   . Benign tumor of labia majora     a. resected/[facility].  . Chronic diastolic CHF (congestive heart failure)     a. 03/2013 Neg MV;  b. 10/2013 Echo: EF 65-70%, LVH, diast dysfxn, Triv MR/TR, mildly dil LA.  Marland Kitchen Hyperlipidemia   . Hypertension   . CKD (chronic kidney disease), stage III    Past Surgical History  Procedure Laterality Date  . Hysterectomy    . Vulva surgery      Allergies  Allergies  Allergen Reactions  . Coreg [Carvedilol]     Bradycardia   . Dye Fdc Red [Red Dye]   . Iodides Hives, Rash and Swelling  . Spironolactone Rash    HPI  28 old female with the above problem list. She has a history of chronic diastolic congestive heart failure as well as stage III chronic kidney disease. I saw her in clinic last week after she called and reported weight gain and edema over the prior weekend.  She did not appear to be particularly volume overloaded on exam but did c/o increased abd girth and weight was up 8 lbs from previous visit.  I had her increase her lasix to 40mg  bid for two additional days.  She says that with that, her wt fell to about 156 but has since climbed back to 189.  She does have DOE but denies pnd, orthopnea, n, v, dizziness, or syncope.  Her biggest complaint in increasing abdominal girth.  She says that her appetite has been very good.  She says that following her cancer surgery, she had a very poor appetite for a year and she lost 50 lbs.  Now that her appetite is better, she says that she is eating much better, watching salt, but snacking on salt free chips and crackers @ bedtime.  Home  Medications  Prior to Admission medications   Medication Sig Start Date End Date Taking? Authorizing Provider  Albuterol Sulfate (PROAIR HFA IN) Inhale into the lungs as needed.   Yes Historical Provider, MD  amLODipine (NORVASC) 10 MG tablet Take 10 mg by mouth daily.   Yes Historical Provider, MD  aspirin 81 MG tablet Take 81 mg by mouth daily.   Yes Historical Provider, MD  atorvastatin (LIPITOR) 20 MG tablet Take 20 mg by mouth daily.   Yes Historical Provider, MD  furosemide (LASIX) 20 MG tablet Take 20 mg by mouth 2 (two) times daily. 03/07/14  Yes Chelsea Hampshire, MD  insulin glargine (LANTUS) 100 UNIT/ML injection Inject 23 Units into the skin at bedtime.    Yes Historical Provider, MD  lactulose (CHRONULAC) 10 GM/15ML solution Take by mouth as needed.    Yes Historical Provider, MD  losartan (COZAAR) 100 MG tablet Take 100 mg by mouth daily.   Yes Historical Provider, MD  methimazole (TAPAZOLE) 5 MG tablet Take 5 mg by mouth daily.   Yes Historical Provider, MD  Potassium Iodide 10 % SOLN by Does not apply route daily.   Yes Historical Provider, MD  saxagliptin HCl (ONGLYZA) 5 MG TABS tablet Take 5 mg by mouth daily.   Yes Historical  Provider, MD  triamterene-hydrochlorothiazide (DYAZIDE) 37.5-25 MG per capsule Take 1 capsule by mouth daily.   Yes Historical Provider, MD    Review of Systems  Increased abd girth, wt gain, mild LEE, and stable/chronic DOE.  All other systems reviewed and are otherwise negative except as noted above.  Physical Exam  Blood pressure 160/70, pulse 64, height 5\' 2"  (1.575 m), weight 159 lb 12 oz (72.462 kg).  BP 140/60 on repeat. General: Pleasant, NAD Psych: Normal affect. Neuro: Alert and oriented X 3. Moves all extremities spontaneously. HEENT: Normal  Neck: Supple without bruits or JVD. Lungs:  Resp regular and unlabored, CTA. Heart: RRR - distant.  No s3, s4, or murmurs. Abdomen: Soft, non-tender, non-distended, BS + x 4.  Extremities: No  clubbing, cyanosis.  Trace bilat LE edema. DP/PT/Radials 2+ and equal bilaterally.  Accessory Clinical Findings  Results for Chelsea, Davis (MRN [id number]) as of 05/17/2014 14:07  Ref. Range 05/09/2014 14:48  Sodium Latest Range: 134-144 mmol/L 141  Potassium Latest Range: 3.5-5.2 mmol/L 3.8  Chloride Latest Range: 97-108 mmol/L 98  CO2 Latest Range: 18-29 mmol/L 26  BUN Latest Range: 8-27 mg/dL 20  Creatinine Latest Range: 0.57-1.00 mg/dL 1.10 (H)  Calcium Latest Range: 8.7-10.3 mg/dL 9.3  GFR calc non Af Amer Latest Range: >59 mL/min/1.73 49 (L)  GFR calc Af Amer Latest Range: >59 mL/min/1.73 56 (L)  Glucose Latest Range: 65-99 mg/dL 190 (H)  BUN/Creatinine Ratio Latest Range: 11-26  18   Assessment & Plan  1.  Acute on chronic diastolic CHF:  Pt presents back today with persistent 8 lb wt gain and increasing abd girth.  Last week, she increased her lasix for a total of 4 days and wt came down to 186 by the end of that trial but has since gone back up.  I checked her renal fxn on her last visit and creat was 1.1.  I have asked her to increase her lasix to 40mg  bid for 1 full week.  She is also on triamterene/hctz.   HR stable.  BP up initially.  Recheck was 140/60.  She will come back to clinic in 1 wk for a nurse visit, bp check, wt check, and bmet.  I am suspicious that some of her wt gain may be related to snacking before bedtime on simple carbs.  We discussed this. She will consider eating carrots or celery instead.   2. HTN:  Initially 160/90 - 140/60 on repeat.  She says that her dtr checks her BP @ home and she is typically 140 or less.  Diuresis as above.  Repeat BP check next week.  She tells me that she is avoiding salt.  3.  Stage III CKD:  Creat 1.1 on 8/3.  Repeat bmet next week.  4. Disposition:  F/U bmet, wt, bp/nurse check next week. She has f/u with Dr. Fletcher Davis on 8/31.  Chelsea Hodgkins, NP 05/17/2014, 1:53 PM

## 2014-05-18 ENCOUNTER — Telehealth: Payer: Self-pay

## 2014-05-18 NOTE — Telephone Encounter (Signed)
Attempted to call patient x 3  No answer no vm

## 2014-05-18 NOTE — Telephone Encounter (Signed)
Nurse with [facility] states pt is SOB, cannot lay down, does not want to call the office due to she doesn't have the $ for a copay. Does have some swelling, her weight is 158.8. States she also faxed this.

## 2014-05-18 NOTE — Telephone Encounter (Signed)
LVM for Chelsea Davis at [facility] 8/12

## 2014-05-19 ENCOUNTER — Telehealth: Payer: Self-pay | Admitting: *Deleted

## 2014-05-19 NOTE — Telephone Encounter (Signed)
Per Conversation with Ignacia Bayley no changes at this time continue with extra Lasix as ordered

## 2014-05-19 NOTE — Telephone Encounter (Signed)
Malachy Mood with [employer] called please call her back. [phone]. She left a message on our voicemail. thanks

## 2014-05-19 NOTE — Telephone Encounter (Signed)
LVM for [facility] 05/19/14

## 2014-05-20 NOTE — Telephone Encounter (Signed)
Attempted to call patient. No answer.  LVM for Malachy Mood at [facility] to inform her of Donnamarie Rossetti response.

## 2014-05-24 ENCOUNTER — Ambulatory Visit (INDEPENDENT_AMBULATORY_CARE_PROVIDER_SITE_OTHER): Payer: Medicare Other | Admitting: *Deleted

## 2014-05-24 ENCOUNTER — Ambulatory Visit: Payer: Medicare Other | Admitting: *Deleted

## 2014-05-24 VITALS — BP 158/68

## 2014-05-24 DIAGNOSIS — I509 Heart failure, unspecified: Secondary | ICD-10-CM

## 2014-05-24 DIAGNOSIS — I5033 Acute on chronic diastolic (congestive) heart failure: Secondary | ICD-10-CM

## 2014-05-24 DIAGNOSIS — I10 Essential (primary) hypertension: Secondary | ICD-10-CM

## 2014-05-24 NOTE — Progress Notes (Signed)
Patient here today for BP check

## 2014-05-25 ENCOUNTER — Telehealth: Payer: Self-pay

## 2014-05-25 LAB — BASIC METABOLIC PANEL
BUN / CREAT RATIO: 21 (ref 11–26)
BUN: 20 mg/dL (ref 8–27)
CO2: 28 mmol/L (ref 18–29)
Calcium: 9.3 mg/dL (ref 8.7–10.3)
Chloride: 100 mmol/L (ref 97–108)
Creatinine, Ser: 0.96 mg/dL (ref 0.57–1.00)
GFR calc non Af Amer: 58 mL/min/{1.73_m2} — ABNORMAL LOW (ref >59)
GFR, EST AFRICAN AMERICAN: 66 mL/min/{1.73_m2} (ref >59)
GLUCOSE: 77 mg/dL (ref 65–99)
Potassium: 3.5 mmol/L (ref 3.5–5.2)
SODIUM: 147 mmol/L — AB (ref 134–144)

## 2014-05-25 NOTE — Telephone Encounter (Signed)
Nurse with [facility] needs to talk about pt visit from yesterday. She has an appt at 10:00

## 2014-05-25 NOTE — Telephone Encounter (Signed)
Spoke with nurse discussed her blood pressure and labs  Malachy Mood RN verbalized understanding

## 2014-06-02 DIAGNOSIS — E0590 Thyrotoxicosis, unspecified without thyrotoxic crisis or storm: Secondary | ICD-10-CM | POA: Insufficient documentation

## 2014-06-06 ENCOUNTER — Ambulatory Visit: Payer: Medicare Other | Admitting: Cardiovascular Disease

## 2014-06-24 ENCOUNTER — Ambulatory Visit (INDEPENDENT_AMBULATORY_CARE_PROVIDER_SITE_OTHER): Payer: Medicare Other | Admitting: Cardiovascular Disease

## 2014-06-24 ENCOUNTER — Encounter: Payer: Self-pay | Admitting: Cardiovascular Disease

## 2014-06-24 VITALS — BP 160/68 | HR 60 | Ht 60.0 in | Wt 171.0 lb

## 2014-06-24 DIAGNOSIS — I10 Essential (primary) hypertension: Secondary | ICD-10-CM

## 2014-06-24 DIAGNOSIS — I5032 Chronic diastolic (congestive) heart failure: Secondary | ICD-10-CM

## 2014-06-24 NOTE — Assessment & Plan Note (Signed)
Blood pressure is mildly elevated but I did not make any changes today. She had previous bradycardia with carvedilol.

## 2014-06-24 NOTE — Progress Notes (Signed)
HPI  This is a 78 year old African American female who is here today for a followup visit regarding chronic diastolic heart failure and refractory hypertension. She was hospitalized in June of 2014 due to worsening dyspnea and 20 pounds weight gain with lower extremity edema and swelling in her back. She was hypertensive on presentation with blood pressure of 206/78.  BNP was greater than 800. She improved quickly with IV Lasix. Cardiac enzymes were mildly elevated. She ran out of her blood pressure medications few weeks before presentation. She underwent a nuclear stress test with Dr. Rosario Jacks which was negative for ischemia at submaximal work load. Echocardiogram while hospitalized showed hyperdynamic LV systolic function with severe asymmetrical left ventricular hypertrophy without LVOT gradient and no significant valvular abnormalities.  Renal artery duplex showed no evidence of renal artery stenosis.  She was hospitalized at [facility] in 10/2013 for acute on chronic diastolic heart failure.  Carvedilol was stopped due to bradycardia. Echo showed an EF of 65-70% with severe LVH and grade 1 diastolic dysfunction. LE venous duplex showed no DVT. Liver ultrasound showed no evidence of cirrhosis or abnormal portal veins. BNP was elevated. TnI was 0.4. She was treated for acute on chronic diastolic heart failure but kidney function worsened with diuresis.  She continues to have significant fluctuation in her weight. We have been emphasizing low sodium diet that she is not consistent with this. She eats significant amount of snacks that are high in sodium.     Allergies  Allergen Reactions  . Amlodipine     Other reaction(s): Unknown edema  . Coreg [Carvedilol]     Bradycardia   . Dye Fdc Red [Red Dye]   . Iodides Hives, Rash and Swelling  . Spironolactone Rash     Current Outpatient Prescriptions on File Prior to Visit  Medication Sig Dispense Refill  . Albuterol Sulfate (PROAIR HFA IN)  Inhale into the lungs as needed.      Marland Kitchen amLODipine (NORVASC) 10 MG tablet Take 10 mg by mouth daily.      Marland Kitchen aspirin 81 MG tablet Take 81 mg by mouth daily.      Marland Kitchen atorvastatin (LIPITOR) 20 MG tablet Take 20 mg by mouth daily.      . furosemide (LASIX) 20 MG tablet Take 20 mg by mouth 2 (two) times daily.      Marland Kitchen lactulose (CHRONULAC) 10 GM/15ML solution Take by mouth as needed.       Marland Kitchen losartan (COZAAR) 100 MG tablet Take 100 mg by mouth daily.      . methimazole (TAPAZOLE) 5 MG tablet Take 5 mg by mouth daily.      . Potassium Iodide 10 % SOLN by Does not apply route daily.      Marland Kitchen triamterene-hydrochlorothiazide (DYAZIDE) 37.5-25 MG per capsule Take 1 capsule by mouth daily.       No current facility-administered medications on file prior to visit.     Past Medical History  Diagnosis Date  . Diabetes mellitus without complication   . Benign tumor of labia majora     a. resected/[facility].  . Chronic diastolic CHF (congestive heart failure)     a. 03/2013 Neg MV;  b. 10/2013 Echo: EF 65-70%, LVH, diast dysfxn, Triv MR/TR, mildly dil LA.  Marland Kitchen Hyperlipidemia   . Hypertension   . CKD (chronic kidney disease), stage III      Past Surgical History  Procedure Laterality Date  . Hysterectomy    . Vulva surgery  Family History  Problem Relation Age of Onset  . Hypertension Mother   . Hypertension Father      History   Social History  . Marital Status: Married    Spouse Name: N/A    Number of Children: N/A  . Years of Education: N/A   Occupational History  . Not on file.   Social History Main Topics  . Smoking status: Never Smoker   . Smokeless tobacco: Not on file  . Alcohol Use: No  . Drug Use: No  . Sexual Activity: Not on file   Other Topics Concern  . Not on file   Social History Narrative  . No narrative on file     PHYSICAL EXAM   BP 160/68  Pulse 60  Ht 5' (1.524 m)  Wt 171 lb (77.565 kg)  BMI 33.40 kg/m2 Constitutional: She is oriented to person,  place, and time. She appears well-developed and well-nourished. No distress.  HENT: No nasal discharge.  Head: Normocephalic and atraumatic.  Eyes: Pupils are equal and round. Right eye exhibits no discharge. Left eye exhibits no discharge.  Neck: Normal range of motion. Neck supple. No JVD present. No thyromegaly present.  Cardiovascular: Normal rate, regular rhythm, normal heart sounds. Exam reveals no gallop and no friction rub. No murmur heard.  Pulmonary/Chest: Effort normal and breath sounds normal. No stridor. No respiratory distress. She has no wheezes. She has no rales. She exhibits no tenderness.  Abdominal: Soft. Bowel sounds are normal. She exhibits no distension. There is no tenderness. There is no rebound and no guarding.  Musculoskeletal: Normal range of motion. She exhibits no edema and no tenderness.  Neurological: She is alert and oriented to person, place, and time. Coordination normal.  Skin: Skin is warm and dry. No rash noted. She is not diaphoretic. No erythema. No pallor.  Psychiatric: She has a normal mood and affect. Her behavior is normal. Judgment and thought content normal.      ASSESSMENT AND PLAN

## 2014-06-24 NOTE — Assessment & Plan Note (Signed)
Although her weight increased, she appears to be euvolemic and I recommend continuing current medications without changes. I again had a prolonged discussion with her about the importance of low sodium diet.

## 2014-06-24 NOTE — Patient Instructions (Signed)
Continue same medications.  Follow up in 3 months.  

## 2014-07-05 DIAGNOSIS — E1149 Type 2 diabetes mellitus with other diabetic neurological complication: Secondary | ICD-10-CM | POA: Insufficient documentation

## 2014-07-05 DIAGNOSIS — Z794 Long term (current) use of insulin: Secondary | ICD-10-CM | POA: Insufficient documentation

## 2014-07-05 DIAGNOSIS — E11649 Type 2 diabetes mellitus with hypoglycemia without coma: Secondary | ICD-10-CM | POA: Insufficient documentation

## 2014-09-23 ENCOUNTER — Ambulatory Visit (INDEPENDENT_AMBULATORY_CARE_PROVIDER_SITE_OTHER): Payer: Medicare Other | Admitting: Cardiovascular Disease

## 2014-09-23 ENCOUNTER — Encounter: Payer: Self-pay | Admitting: Cardiovascular Disease

## 2014-09-23 VITALS — BP 168/60 | HR 60 | Ht 60.0 in | Wt 180.5 lb

## 2014-09-23 DIAGNOSIS — I5032 Chronic diastolic (congestive) heart failure: Secondary | ICD-10-CM

## 2014-09-23 DIAGNOSIS — I10 Essential (primary) hypertension: Secondary | ICD-10-CM

## 2014-09-23 MED ORDER — FUROSEMIDE 40 MG PO TABS: 40.0000 mg | ORAL_TABLET | Freq: Every day | ORAL | Status: DC

## 2014-09-23 NOTE — Assessment & Plan Note (Signed)
She appears to be mildly fluid overloaded. I again discussed with her the importance of low sodium diet. I changed Lasix to 40 mg once daily.

## 2014-09-23 NOTE — Patient Instructions (Signed)
Your physician has recommended you make the following change in your medication:  Change Lasix to 40 mg once daily   Your physician wants you to follow-up in: 6 months with Dr. Fletcher Anon. You will receive a reminder letter in the mail two months in advance. If you don't receive a letter, please call our office to schedule the follow-up appointment.

## 2014-09-23 NOTE — Assessment & Plan Note (Signed)
Blood pressure continues to be elevated. She had previous bradycardia on carvedilol. She appears to be fluid overloaded and hopefully blood pressure improves with more diuresis. If not, hydralazine can be considered.

## 2014-09-23 NOTE — Progress Notes (Signed)
HPI  This is a 78 year old African American female who is here today for a followup visit regarding chronic diastolic heart failure and refractory hypertension. She was hospitalized in June of 2014 due to worsening dyspnea and 20 pounds weight gain with lower extremity edema and swelling in her back. She was hypertensive on presentation with blood pressure of 206/78.  BNP was greater than 800. She improved quickly with IV Lasix. Cardiac enzymes were mildly elevated. She ran out of her blood pressure medications few weeks before presentation. She underwent a nuclear stress test with Dr. Rosario Jacks which was negative for ischemia at submaximal work load. Echocardiogram while hospitalized showed hyperdynamic LV systolic function with severe asymmetrical left ventricular hypertrophy without LVOT gradient and no significant valvular abnormalities.  Renal artery duplex showed no evidence of renal artery stenosis.  She was hospitalized at [facility] in 10/2013 for acute on chronic diastolic heart failure.  Carvedilol was stopped due to bradycardia. Echo showed an EF of 65-70% with severe LVH and grade 1 diastolic dysfunction. LE venous duplex showed no DVT. Liver ultrasound showed no evidence of cirrhosis or abnormal portal veins. BNP was elevated. TnI was 0.4. She was treated for acute on chronic diastolic heart failure but kidney function worsened with diuresis.  She continues to have significant fluctuation in her weight. We have been emphasizing low sodium diet that she is not consistent with this.  She gained 9 pounds since last visit with increased leg edema worse on the right side. She is supposed to take Lasix 20 mg twice daily. However, she forgets to take the afternoon dose most of the time.    Allergies  Allergen Reactions  . Amlodipine     Other reaction(s): Unknown edema  . Coreg [Carvedilol]     Bradycardia   . Dye Fdc Red [Red Dye]   . Iodides Hives, Rash and Swelling  . Spironolactone Rash       Current Outpatient Prescriptions on File Prior to Visit  Medication Sig Dispense Refill  . Albuterol Sulfate (PROAIR HFA IN) Inhale into the lungs as needed.    Marland Kitchen amLODipine (NORVASC) 10 MG tablet Take 10 mg by mouth daily.    Marland Kitchen aspirin 81 MG tablet Take 81 mg by mouth daily.    Marland Kitchen atorvastatin (LIPITOR) 20 MG tablet Take 20 mg by mouth daily.    . furosemide (LASIX) 20 MG tablet Take 20 mg by mouth 2 (two) times daily.    . insulin NPH-regular Human (NOVOLIN 70/30) (70-30) 100 UNIT/ML injection Inject into the skin. Takes 50 units am and 50 units pm daily.    Marland Kitchen lactulose (CHRONULAC) 10 GM/15ML solution Take by mouth as needed.     Marland Kitchen losartan (COZAAR) 100 MG tablet Take 100 mg by mouth daily.    . methimazole (TAPAZOLE) 5 MG tablet Take 5 mg by mouth daily.    . Potassium Iodide 10 % SOLN by Does not apply route daily.    . Simethicone (GAS-X PO) Take by mouth as needed.    . triamterene-hydrochlorothiazide (DYAZIDE) 37.5-25 MG per capsule Take 1 capsule by mouth daily.     No current facility-administered medications on file prior to visit.     Past Medical History  Diagnosis Date  . Diabetes mellitus without complication   . Benign tumor of labia majora     a. resected/[facility].  . Chronic diastolic CHF (congestive heart failure)     a. 03/2013 Neg MV;  b. 10/2013 Echo: EF  65-70%, LVH, diast dysfxn, Triv MR/TR, mildly dil LA.  Marland Kitchen Hyperlipidemia   . Hypertension   . CKD (chronic kidney disease), stage III      Past Surgical History  Procedure Laterality Date  . Hysterectomy    . Vulva surgery       Family History  Problem Relation Age of Onset  . Hypertension Mother   . Hypertension Father      History   Social History  . Marital Status: Married    Spouse Name: N/A    Number of Children: N/A  . Years of Education: N/A   Occupational History  . Not on file.   Social History Main Topics  . Smoking status: Never Smoker   . Smokeless tobacco: Not on file  .  Alcohol Use: No  . Drug Use: No  . Sexual Activity: Not on file   Other Topics Concern  . Not on file   Social History Narrative     PHYSICAL EXAM   BP 168/60 mmHg  Pulse 60  Ht 5' (1.524 m)  Wt 180 lb 8 oz (81.874 kg)  BMI 35.25 kg/m2 Constitutional: She is oriented to person, place, and time. She appears well-developed and well-nourished. No distress.  HENT: No nasal discharge.  Head: Normocephalic and atraumatic.  Eyes: Pupils are equal and round. Right eye exhibits no discharge. Left eye exhibits no discharge.  Neck: Normal range of motion. Neck supple. No JVD present. No thyromegaly present.  Cardiovascular: Normal rate, regular rhythm, normal heart sounds. Exam reveals no gallop and no friction rub. No murmur heard.  Pulmonary/Chest: Effort normal and breath sounds normal. No stridor. No respiratory distress. She has no wheezes. She has no rales. She exhibits no tenderness.  Abdominal: Soft. Bowel sounds are normal. She exhibits no distension. There is no tenderness. There is no rebound and no guarding.  Musculoskeletal: Normal range of motion. She exhibits no edema and no tenderness.  Neurological: She is alert and oriented to person, place, and time. Coordination normal.  Skin: Skin is warm and dry. No rash noted. She is not diaphoretic. No erythema. No pallor.  Psychiatric: She has a normal mood and affect. Her behavior is normal. Judgment and thought content normal.      ASSESSMENT AND PLAN

## 2015-01-24 ENCOUNTER — Telehealth: Payer: Self-pay | Admitting: *Deleted

## 2015-01-24 NOTE — Discharge Summary (Signed)
PATIENT NAME:  Chelsea Davis, Chelsea Davis MR#:  [id number] DATE OF BIRTH:  June 08, 1936  DATE OF ADMISSION:  09/11/2012 DATE OF DISCHARGE:  09/12/2012  ADMISSION DIAGNOSIS: Labial cellulitis.  DISCHARGE DIAGNOSES: 1. Labial mass. 2. Possible labial cellulitis.  3. Hypertension. 4. Diabetes.  CONSULTS:  1. Dr. Pat Patrick. 2. OB-GYN.  LABORATORY, DIAGNOSTIC AND RADIOLOGIC DATA: White blood cells 7.4, hemoglobin 11, hematocrit 35, platelets 233, sodium 143, potassium 4.0, chloride 108, bicarbonate 29, BUN 20, creatinine 1.18, glucose 113. Blood cultures no growth.   HOSPITAL COURSE: 79 year old female directly admitted for possible labial cellulitis. For further details, please refer to the history and physical.  1. Labial mass. Patient was seen by Dr. Pat Patrick who suspected this is a labial mass, possibly cancer. Patient is also currently waiting to be seen by GYN. The plan will probably be for outpatient biopsy. We did treat the infection with antibiotics as well. Patient will be discharged on Keflex and have close OB/GYN follow up.  2. Diabetes. Patient will continue outpatient medications. 3. Hypertension. Patient will continue outpatient medications.   DISCHARGE MEDICATIONS:  1. Novolin 70/30, 55 units in the morning, 35 at night.  2. HCTZ 25 mg daily.  3. Atorvastatin 20 mg at bedtime.  4. Norvasc 10 mg daily.  5. Pioglitazone 30 mg daily.  6. Aspirin 81 mg daily. 7. Losartan 100 mg daily.  8. Atenolol 50 mg daily.  9. Keflex 500 mg p.o. q.8 hours x7 days.   DISCHARGE DIET: Low sodium, low fat, low cholesterol. Consistency regular.   DISCHARGE FOLLOW UP: Patient will follow up with her primary care physician, Dr. Lennox Grumbles, in one week as well as Dr. Jonelle Sidle Klett-Brothers next week.   TIME SPENT: Approximately 35 minutes.   ____________________________ Donell Beers. Benjie Karvonen, MD spm:cms D: 09/12/2012 11:00:24 ET T: 09/12/2012 11:10:56 ET JOB#: [id number]  cc: Rashika Bettes P. Benjie Karvonen, MD, <Dictator> [name],  MD Donell Beers Hillery Zachman MD ELECTRONICALLY SIGNED 09/12/2012 11:58

## 2015-01-24 NOTE — Telephone Encounter (Signed)
Pt daughter calling stating that her PCP stated that pt is having problems with her kidneys And he took her off lasiks, she would like [place] to know,for this is for her CHF They need to know what to do. For right now she is not taking anything for that.  Please advise.

## 2015-01-24 NOTE — Telephone Encounter (Signed)
I do not see Phillis listed on pt's DPR.  Cannot speak w/ her regarding pt.

## 2015-01-24 NOTE — H&P (Signed)
PATIENT NAME:  Chelsea Davis, Chelsea Davis MR#:  [id number] DATE OF BIRTH:  1936/08/21  DATE OF ADMISSION:  09/11/2012  PRIMARY CARE PHYSICIAN: Delight Stare, M.D.   CHIEF COMPLAINT: Labial swelling and cellulitis.   HISTORY OF PRESENT ILLNESS:  A 79 year old female who was admitted directly from her primary care physician's office with labial swelling and cellulitis for several weeks. The patient was embarrassed to tell her husband as well as her primary care physician. She presents to the MD's office with intense swelling and burning of that area along with some erythema. The patient was admitted for direct admit for IV antibiotics and surgical consultation.   REVIEW OF SYSTEMS: CONSTITUTIONAL: The patient has some fatigue and weakness. No fevers or chills. EYES: No blurred or double vision. ENT: No tinnitus, ear pain, epistaxis or redness of oropharynx. RESPIRATORY: No cough, wheezing, hemoptysis or chronic obstructive pulmonary disease. CARDIOVASCULAR: No chest pain, orthopnea, arrhythmias.  She has had some mild edema. No palpitations or syncope. GASTROINTESTINAL: No nausea, vomiting, diarrhea, abdominal pain, melena, or ulcers. GU: Denies any dysuria, renal calculi, frequency, or urgency. ENDOCRINE: No polyuria or polydipsia or thyroid problems. HEME/LYMPH: No anemia or easy bruising. SKIN: She has irritation of that labial area. No other rash or lesions. MUSCULOSKELETAL: No limited activity. No pain in the neck or shoulders. NEUROLOGIC: No history of cerebrovascular accident, transient ischemic attack or seizures. PSYCHIATRIC: No history of anxiety or depression.   PAST MEDICAL HISTORY:  1. Hypertension.  2. Diabetes.  3. History of boils.   MEDICATIONS: We does not have the patient's medication list at this time.   PAST SURGICAL HISTORY: Hysterectomy.   ALLERGIES: No known drug allergies.   SOCIAL HISTORY: No tobacco, alcohol, or drug use.   FAMILY HISTORY: Negative for hypertension, coronary  artery disease or cerebrovascular accident.   PHYSICAL EXAMINATION: VITAL SIGNS: Temperature 98.2, pulse 57, respirations 18, blood pressure 153/47, 92% on room air.   GENERAL: The patient is alert, oriented and not in acute distress.   HEENT: Head is atraumatic. Pupils are round and reactive. Sclerae anicteric. Mucous membranes are moist. Oropharynx is clear.   NECK: Supple without jugular venous distention, carotid bruit or enlarged thyroid.   CARDIOVASCULAR: Regular rate and rhythm. No murmurs, gallops, or rubs. PMI is not displaced.   LUNGS: Clear to auscultation bilaterally without crackles, rales, rhonchi or wheezing. Normal to percussion.   ABDOMEN: Bowel sounds are positive. Nontender, obese. Hard to appreciate organomegaly due to body habitus.   EXTREMITIES: Very minimal nonpitting edema.   NEUROLOGICAL: Cranial nerves II through XII are grossly intact. There are no focal deficits.   SKIN: Around her labia has a large area of cellulitis with erythema. Her right labia is very swollen and tender to touch. Could not appreciate any kind of obvious abscess.   LABORATORY, DIAGNOSTIC, AND RADIOLOGICAL DATA: There is none. They are pending at this time.   ASSESSMENT AND PLAN: This is a 79 year old female who presented from MD office with labial cellulitis.  1. Cellulitis. The patient will be started on vancomycin. Blood cultures have been ordered as well as routine laboratory. Surgical consult has been ordered.  2. Hypertension. Once we know the patient's medications, we can restart this. For now, I have written p.r.n. hydralazine.  3. Diabetes. The patient will be on an ADA diet and sliding scale insulin.   CODE STATUS: The patient is a FULL CODE status.   TIME SPENT: Approximately 45 minutes.  ____________________________ Donell Beers. Benjie Karvonen, MD spm:ap D:  09/11/2012 13:19:43 ET T: 09/11/2012 13:49:00 ET JOB#: [id number]  cc: Lavonne Kinderman P. Benjie Karvonen, MD, <Dictator> [name], MD Donell Beers  Syvanna Ciolino MD ELECTRONICALLY SIGNED 09/12/2012 11:58

## 2015-01-25 NOTE — Telephone Encounter (Signed)
Can you schedule her to see Dr. Fletcher Anon or Thurmond Butts soon? Thank you.

## 2015-01-25 NOTE — Telephone Encounter (Signed)
Spoke w/ pt.  She reports that Dr. Rudene Christians stopped her lasix due to her kidneys.  She reports increased swelling, wt today 171, though she does not weigh herself daily. She would like to know if Dr. Fletcher Anon will put her on another med, as she is not feeling well.  She denies edema or SOB, can only tell me that she "feels bad".  Advised pt that I will make Dr. Fletcher Anon aware, but in the meantime to contact Dr. Theodore Demark office. She is agreeable, though she does take some discussion to understand.

## 2015-01-25 NOTE — Telephone Encounter (Signed)
Can not solve this over the phone. Needs an office visit and reviewing recent records and labs.

## 2015-01-27 NOTE — Consult Note (Signed)
PATIENT NAME:  Chelsea Davis, Chelsea Davis MR#:  [id number] DATE OF BIRTH:  July 31, 1936  DATE OF CONSULTATION:  03/26/2013  REFERRING PHYSICIAN: Dr. Laurin Coder.  CONSULTING PHYSICIAN:  Yazlin Ekblad A. Fletcher Anon, MD  PRIMARY CARE PHYSICIAN: Dr. Rosario Jacks.  REASON FOR CONSULTATION: Congestive heart failure and possible myocardial infarction.   HISTORY OF PRESENT ILLNESS: This is a pleasant 79 year old [place] American female with known history of hypertension, diabetes and tumor resection from her labia in December 2013 at [facility]. She reports a gradual weight gain over the last few months. She gained about 20 pounds. This has been associated with swelling in her back as well as lower extremities. Over the last few days before presentation, this was associated with significant dyspnea without chest pain. She reports that she switched her primary care physician recently and was out of her blood pressure medications for at least 3 weeks. On presentation, she was noted to be hypertensive with a blood pressure of 206/78. She was not tachycardic and not hypoxic. Her labs showed a BNP around 800. She was given 1 dose of IV Lasix with very good urine output. She is negative around 1.5 L so far. Her blood pressure medications were resumed and she currently feels better. Her labs showed mildly elevated troponin and CK-MB. The patient feels significantly better this morning. She denies any recent chest discomfort.   PAST MEDICAL HISTORY:  1.  Hypertension.  2.  Diabetes mellitus, currently insulin requiring.  3.  Labia majora tumor, status post resection in December.   HOME MEDICATIONS: Include: 1.  Insulin 70/30.  2.  Aspirin 81 mg once daily.  3.  Losartan 100 mg once daily.  4.  Hydrochlorothiazide 25 mg once daily.  5.  Vitamin D 5000 units once weekly.   PAST SURGICAL HISTORY: Includes hysterectomy and labia surgery.   ALLERGIES: No known drug allergies.   SOCIAL HISTORY: Negative for smoking, alcohol or recreational  drug use.   FAMILY HISTORY: Negative for premature coronary artery disease.   REVIEW OF SYSTEMS: A 10-point review of systems was performed. It is negative other than what is mentioned in the HPI.   PHYSICAL EXAMINATION:  GENERAL: The patient appears to be at her stated age, in no acute distress.  VITAL SIGNS: Temperature 97.9, pulse 60, respiratory rate 18, blood pressure is 143/87 and oxygen saturation is 98% on room air.  HEENT: Normocephalic, atraumatic.  NECK: No jugular venous distention or carotid bruits.  RESPIRATORY: Normal respiratory effort with no use of accessory muscles. Auscultation reveals normal breath sounds.  CARDIOVASCULAR: Normal PMI. Normal S1 and S2 with no gallops or murmurs.  ABDOMEN: Benign, nontender, and nondistended.  EXTREMITIES: No clubbing or cyanosis. There is trace edema bilaterally. She reports significant improvement in edema since yesterday.  SKIN: Warm and dry with no rash.  PSYCHIATRIC: She is alert, oriented x3 with normal mood and affect.   LABORATORY, DIAGNOSTIC, AND RADIOLOGICAL DATA: Creatinine is 1.03. BNP was 816. Troponin was 0.12 and increased to 0.16. CK-MB was 12.9. CBC is unremarkable. TSH is normal. Cortisol level and human growth hormone level were also normal. EKG showed sinus rhythm with left bundle branch block.   IMPRESSION:  1.  Acute diastolic heart failure, likely due to hypertensive urgency.  2.  Elevated blood pressure due to being out of medications for 3 weeks.  3.  Mildly elevated cardiac enzymes, likely due to supply demand ischemia related to hypertension.   RECOMMENDATIONS: The patient had a recent echocardiogram, which showed hyperdynamic LV  systolic function with severe asymmetrical left ventricular hypertrophy without evidence of LVOT obstruction. There was no significant valvular abnormalities. She reports being out of her blood pressure medication for at least 2 weeks, which is likely the culprit for her current  presentation. She improved significantly with IV furosemide and seems to be close to her baseline. She has no convincing symptoms of myocardial infarction. I suspect that the elevated cardiac enzymes are likely due to supply demand ischemia. I agree with current medical therapy. It appears that her blood pressure is well controlled after resuming her medication. She might require a loop diuretic as needed. I also discussed with her the importance of a low-sodium diet, which she has not been compliant with. Due to her risk factors for coronary artery disease and abnormal EKG with a left bundle branch block, I recommend an outpatient ischemic evaluation with a pharmacologic nuclear stress test. She informs me that this is already scheduled on 07/08 with Dr. Rosario Jacks and she should keep that appointment. The patient's workup for hormonal issues have been negative so far including normal thyroid level, cortisol and growth factor.  ____________________________ Mertie Clause. Fletcher Anon, MD maa:aw D: 03/26/2013 08:30:41 ET T: 03/26/2013 08:56:34 ET JOB#: [id number]  cc: Trine Fread A. Fletcher Anon, MD, <Dictator> Isla Pence, MD [place], MD [facility] Ferne Reus MD ELECTRONICALLY SIGNED 03/29/2013 8:22

## 2015-01-27 NOTE — H&P (Signed)
PATIENT NAME:  Chelsea Davis, Chelsea Davis MR#:  [id number] DATE OF BIRTH:  07/09/1936  DATE OF ADMISSION:  03/25/2013  PRIMARY CARE PHYSICIAN: Casilda Carls, MD  REFERRING PHYSICIAN: Shirlyn Goltz, MD  CHIEF COMPLAINT: Edema on whole body.   HISTORY OF PRESENT ILLNESS:  Chelsea Davis is a very nice 79 year old female who has history of hypertension, diabetes and a tumor removed from her labia down in [place]. The patient comes today with a history of having surgery for removing a benign tumor from her labia back in December. Since then she notices a decline in her condition. As far as gaining weight, she has gained over 20 pounds since then.  She has noticed the problem got worse within a week and she has been complaining of shortness of breath with activity within the last month. The patient is able to walk 20 feet and then she gets really exhausted, has to stop, has shortness of breath and work of breathing. As far as her swelling, she says that occasionally it is worse in the legs and he gets worse and goes away whenever she rests. At this moment, the patient does not have any significant pitting edema, but she does have edema on the back of her neck. She has a hump, she has a very full face, and she does have also some swelling on the joints and around her arms. The patient received 1 dose of Lasix. She states that actually her edema is better right now. The patient is admitted for evaluation of this condition. Actually she did have an elevation of troponin, but no chest pain whatsoever. The patient is admitted. She is seeing Dr. Rosario Jacks for this. She had an echocardiogram, but we are not able to access, but apparently has been sent to Dr. Fletcher Anon for what we are going to consult him and ask him to see if he can bring it on the chart.   REVIEW OF SYSTEMS:  A 12 system review is done.  CONSTITUTIONAL:  The patient states she has been fatigued and weak. No fever. No chills.  EYES: No blurry vision, double vision.   ENT: No tinnitus, ear pain, epistaxis or redness of the eyes. RESPIRATORY: No cough. No wheezing. No hemoptysis. Positive shortness of breath with ambulation.  CARDIOVASCULAR: No chest pain. No orthopnea.  No arrhythmias. Positive edema of the lower extremities, upper extremities, whole body. No palpitations. No syncope.  GASTROINTESTINAL: No nausea, vomiting, diarrhea. Positive constipation. No melena or hematemesis.  GENITOURINARY:  No dysuria, hematuria or changes in frequency.  ENDOCRINE: No polyuria, polydipsia or polyphagia. She has diabetes and apparently has been well controlled. She denies any thyroid problems, cold or heat intolerance.  HEME AND LYMPH:  No anemia, easy bruising or swollen glands.  SKIN: No rashes or petechiae.  No new lesions at this moment.  MUSCULOSKELETAL: No significant edema of joints. No neck pain or shoulder pain. No gout.  NEUROLOGIC: No CVAs or TIAs in the past. No weakness.  PSYCHIATRIC: No significant anxiety or depression.   PAST MEDICAL HISTORY: 1.  Hypertension.  2.  Diabetes, insulin-dependent, apparently well controlled.  3.  Labia majora tumor, removed, benign.   MEDICATIONS:  1.  Insulin 70/30, 55 units every morning.  She used to take 35 at night, but her blood sugars have been low so that was stopped. 2.  Aspirin 81 mg daily. 3.  Losartan 100 mg daily. 4.  Hydrochlorothiazide 25 mg daily. 5.  Vitamin D 5000 units once a week.  PAST SURGICAL HISTORY: 1.  Hysterectomy in December 2014. 2.  Labia surgery for removal of benign tumor.   ALLERGIES: No known drug allergies.  SOCIAL HISTORY: The patient does not smoke, does not drink, does not use drugs. Lives with her husband.   FAMILY HISTORY: Negative for coronary artery disease, hypertension, CVAs or cancer.   PHYSICAL EXAMINATION: VITAL SIGNS: Blood pressure 206/78, pulse 71, respirations 18 and temperature 98.1.  GENERAL: The patient is alert and oriented x 3. No acute distress. No  respiratory distress. Hemodynamically stable.  HEENT: Her pupils are equal and reactive. Extraocular movements are intact. Mucosa is moist. Anicteric sclerae. Pink conjunctivae. No oral lesions. No oropharyngeal exudates.  NECK:  Is engorged, obese, but no JVD. No thyromegaly. No adenopathy. No carotid bruits.  HEART:  Regular rate and rhythm. Positive systolic ejection murmur, 2 out of 6. The patient was not aware of this murmur, decrescendo. No tenderness to palpation on anterior chest wall. No chest pain whatsoever. Positive displacement of PMI to anterior axillary line.  LUNGS: Clear without any wheezing or crepitus. No use of accessory muscles.  ABDOMEN: Enlarged, obese. No significant pitting edema. The patient states that she is enlarged and swollen. No tenderness. No dullness to percussion. No rebound.  GENITALIA: Deferred.  EXTREMITIES: At this moment, there is some mild edema, subcutaneous edema, on the upper extremities and lower extremities, but no pitting edema. There is some mild edema at the level of the ankles and there is a hump on her back. Her face is round and she has a Cushingoid appearance. NEUROLOGIC: Cranial nerves II through XII are intact. Her strength is equal in all 4 extremities. DTRs +2.  LYMPHATICS: Negative for lymphadenopathy in neck or supraclavicular area.  SKIN: No rashes, petechiae or infiltration of swelling on the skin, other than some edema. VASCULAR:  Good pulses.  Capillary refill less than 3.  PSYCHIATRIC: Negative agitation, alert and oriented x 3.  MUSCULOSKELETAL: No significant joint effusions or joint swelling.   LABORATORY DATA:  So far troponin is 0.12; it is slightly elevated. Albumin is 3.1, glucose is 234, BUN 19. BMP 816. GFR is around 48%, for her age and height.  White count is 9.1, hemoglobin 14 and platelets are normal.  Pending UA.  ASSESSMENT AND PLAN: 1.  Significant edema, anasarca.  The patient is admitted for evaluation of this  condition. With the slight elevation of her BNP and with significant cardiomegaly on x-rays and no changes on EKG, it is hard to say that this is related to congestive heart failure, although we are going to investigate that.  Cardiology consultation ordered. The patient already had an echocardiogram a week ago. We are going to try to get it from Dr. Tyrell Antonio office.  Other causes of the anasarca could be renal, but the patient has no significant kidney failure. She does have decreased GFR. Chronic kidney failure is probably around stage II to III, but no significant signs of fluid overloading.  2.  The patient has also a hump and moon facies for what we are going to evaluate for possible Cushing syndrome. If cortisol is elevated, and growth hormone is elevated, we are going to go ahead and get an ultrasound or CT scan of the abdomen to evaluate her adrenal glands. We are also going to get a TSH to evaluate thyroid and go from there. A growth hormone has been ordered to evaluate for increase of production of this hormone.  3.  As far as  elevation of troponin, we are going to treat it as acute coronary syndrome. The patient is not having any chest pain though, but she could be in congestive heart failure related to that for what she is going to be on a heparin drip, beta blocker, ACE inhibitor, aspirin, nitroglycerin and oxygen p.r.n.  4.  Diabetes. Insulin sliding scale.  Continue 70/30.  Accu-Cheks to be checked as well.  5.  Hypertension. The patient has malignant hypertension, increase of blood pressure with possible end-organ damage with elevation of troponin for what she is going to be treated with p.r.n. medications, hydralazine, metoprolol and continue her home medications. The patient is a FULL CODE.   TIME SPENT: I spent about 50 minutes with this admission. ____________________________ Arnold Sink, MD rsg:sb D: 03/25/2013 12:31:25 ET T: 03/25/2013 13:06:47  ET JOB#: [id number]  cc: Muddy Sink, MD, <Dictator> Ysabel Cowgill America Brown MD ELECTRONICALLY SIGNED 04/05/2013 6:43

## 2015-01-27 NOTE — Consult Note (Signed)
Brief Consult Note: Diagnosis: acute diastolic heart failure, uncontrolled hypertension, supply demand ischemia.   Patient was seen by consultant.   Consult note dictated.   Discussed with Attending MD.   Comments: The patient ran out of BP medications for at least 2 weeks before presentation. BP was >200 on presentation.  She improved quickly with IV Lasix (negative 1.5 L). BP is now controlled after resuming home meds.  TnI slightly elevated likely due to supply demand ischemia. She denies symptoms of angina.  Recent echo showed: -  Left ventricle: The cavity size was moderately reduced.   There was severe asymmetric hypertrophy of the septum.   Systolic function was vigorous. The estimated ejection   fraction was in the range of 65% to 70%. There was no   dynamic obstruction. Wall motion was normal; there were no   regional wall motion abnormalities. Doppler parameters are   consistent with abnormal left ventricular relaxation   (grade 1 diastolic dysfunction). - Left atrium: The atrium was mildly dilated.  Continue current meds. She might need Lasix prn for edema/weight gain.  Recommend an outpatient stress test which is already scheduled according to patient.  Electronic Signatures: Kathlyn Sacramento (MD)  (Signed 20-Jun-14 08:20)  Authored: Brief Consult Note   Last Updated: 20-Jun-14 08:20 by Kathlyn Sacramento (MD)

## 2015-01-27 NOTE — Discharge Summary (Signed)
PATIENT NAME:  Chelsea Davis, Chelsea Davis MR#:  [id number] DATE OF BIRTH:  [birth year]  DATE OF ADMISSION:  03/25/2013 DATE OF DISCHARGE:  03/27/2013  REASON FOR ADMISSION:  Edema on the whole body/anasarca.   FINAL DIAGNOSES: 1.  Acute diastolic congestive heart failure.  2.  Septal hypertrophy with no left ventricular outflow tract. 3.  Acute kidney injury.  4.  Edema of the back and face. 5.  Troponin elevation due to troponin leak to malignant hypertension. 6.  Diabetes.  7.  Malignant hypertension.   MEDICATIONS AT DISCHARGE: 1.  Atorvastatin 20 mg once a day. 2.  Aspirin 81 mg once a day.  3.  Vitamin D3 5000 units every week.  4.  Novolin 70/30, 55 units in the morning. 5.  Losartan 100 mg once a day, start taking on 03/28/2013. 6.  Metoprolol 25 mg every 12 hours. 7.  Furosemide 20 mg every other day, start taking on 03/29/2013. 8.  Stop hydrochlorothiazide.  FOLLOWUP:  Dr. Casilda Carls and Dr. Kathlyn Sacramento.  Please follow up with Dr. Rosario Jacks in the next 1 to 2 days to check kidney function.  HOSPITAL COURSE: Ms. Chelsea Davis is a very nice 78 year old female with history of hypertension and diabetes. She had a tumor removed from her labia in [place] back in December. Since then, she started increasing her weight gradually. She states that she had at least 20-pound gain since then. She noticed that the problem got worse and faster within the last week, starting to have some shortness of breath for at least 2 weeks to a month, cannot walk more than 20 feet without getting really exhausted and having to stop and having to take deep breaths.  The patient improves when she rests. She came to the ER because she could not take the swelling anymore. She was evaluated by the ER physician in the ER where she found out that she has edema, gave her some Lasix IV, and also her troponin was elevated for what she was admitted with a potential of acute coronary artery syndrome.  The patient was  evaluated in-house by cardiology. Her troponins were slightly increased of 0.12 up to 0.16 and now at discharge 0.1. Those troponins were due to malignant hypertension with troponin leak.  No acute coronary syndrome.  The patient had an echocardiogram done in the office of Dr. Fletcher Anon. He was able to give me those results. She has significant left ventricular hypertrophy without any evidence of LVOT obstruction.  The patient had a murmur and Dr. Fletcher Anon thinks that it should be related to the hyperdynamic state as the patient did not have any significant obstruction to the flow.  The patient has not taken her medications for over 2 weeks for which whenever she presented to the ER her blood pressure was significantly elevated in the 200s or 100s.  The patient was admitted also with diagnosis of malignant hypertension. Her blood pressure was treated and now we are adding on a beta blocker to her medications to help with her diastolic dysfunction.  She received Lasix and the plan is to send her home on 20 mg today, but her creatinine is elevated today from her baseline. Her creatinine today is 1.73. She really wants to go home for which we are going to let her, as long as she can follow up with Dr. Rosario Jacks on Monday and get her creatinine checked. She is holding her losartan today and she is holding her Lasix until she sees Dr.  Rosario Jacks and get the results of her kidney function.   CONDITION ON DISCHARGE: The patient discharged in good condition with education for CHF and DASH diet.   TIME SPENT:  I spent about 45 minutes with this discharge.    ____________________________ [place] Sink, MD rsg:ce D: 03/27/2013 09:01:37 ET T: 03/27/2013 09:18:22 ET JOB#: [id number]  cc: Otis Sink, MD, <Dictator> Isla Pence, MD Mertie Clause. Fletcher Anon, MD  Roselie Awkward America Brown MD ELECTRONICALLY SIGNED 04/05/2013 6:43

## 2015-01-31 ENCOUNTER — Ambulatory Visit (INDEPENDENT_AMBULATORY_CARE_PROVIDER_SITE_OTHER): Payer: Medicare Other | Admitting: Cardiovascular Disease

## 2015-01-31 ENCOUNTER — Encounter: Payer: Self-pay | Admitting: Cardiovascular Disease

## 2015-01-31 VITALS — BP 138/62 | HR 69 | Ht 60.0 in | Wt 171.5 lb

## 2015-01-31 DIAGNOSIS — I5032 Chronic diastolic (congestive) heart failure: Secondary | ICD-10-CM

## 2015-01-31 DIAGNOSIS — I10 Essential (primary) hypertension: Secondary | ICD-10-CM

## 2015-01-31 NOTE — Assessment & Plan Note (Signed)
I agree that she appears to be volume depleted. She is already on a thiazide diuretic and might not need a loop diuretic. She has a scale at home and  she started wearing herself every day. I advised her to use furosemide only as needed for weight gain of more than 2 pounds in 48 hours or 5 pounds total in one week. She is to continue low-sodium diet and blood pressure control.

## 2015-01-31 NOTE — Progress Notes (Signed)
Primary care physician: Dr. Rogelia Rohrer at [facility]  HPI  This is a 79 year old African American female who is here today for a followup visit regarding chronic diastolic heart failure and refractory hypertension. She was hospitalized in June of 2014 due to worsening dyspnea and 20 pounds weight gain with lower extremity edema and swelling in her back. She was hypertensive on presentation with blood pressure of 206/78.  BNP was greater than 800. She improved quickly with IV Lasix. Cardiac enzymes were mildly elevated. She ran out of her blood pressure medications few weeks before presentation. She underwent a nuclear stress test with Dr. Rosario Jacks which was negative for ischemia at submaximal work load. Echocardiogram while hospitalized showed hyperdynamic LV systolic function with severe asymmetrical left ventricular hypertrophy without LVOT gradient and no significant valvular abnormalities.  Renal artery duplex showed no evidence of renal artery stenosis.  She was hospitalized at [facility] in 10/2013 for acute on chronic diastolic heart failure.  Carvedilol was stopped due to bradycardia. Echo showed an EF of 65-70% with severe LVH and grade 1 diastolic dysfunction. LE venous duplex showed no DVT. Liver ultrasound showed no evidence of cirrhosis or abnormal portal veins. BNP was elevated. TnI was 0.4. She was treated for acute on chronic diastolic heart failure but kidney function worsened with diuresis.  Blood pressure has been controlled recently on amlodipine, losartan, Dyazide and hydralazine. She lost 9 pounds since her last visit. Recent labs with Dr. Alexandria Lodge showed worsening kidney function with a creatinine of 1.4. Thus, Lasix was held and currently she has been taking 40 mg every other day. She has a scaling atenolol and weighs herself. She denies chest pain or worsening dyspnea. She complains of constipation.    Allergies  Allergen Reactions  . Amlodipine     Other reaction(s): Unknown edema  . Coreg  [Carvedilol]     Bradycardia   . Dye Fdc Red [Red Dye]   . Iodides Hives, Rash and Swelling  . Spironolactone Rash     Current Outpatient Prescriptions on File Prior to Visit  Medication Sig Dispense Refill  . Albuterol Sulfate (PROAIR HFA IN) Inhale into the lungs as needed.    Marland Kitchen amLODipine (NORVASC) 10 MG tablet Take 10 mg by mouth daily.    Marland Kitchen aspirin 81 MG tablet Take 81 mg by mouth daily.    Marland Kitchen atorvastatin (LIPITOR) 20 MG tablet Take 20 mg by mouth daily.    . insulin NPH-regular Human (NOVOLIN 70/30) (70-30) 100 UNIT/ML injection Inject into the skin. Takes 30 units am and 12 units pm daily.    Marland Kitchen lactulose (CHRONULAC) 10 GM/15ML solution Take by mouth as needed.     Marland Kitchen losartan (COZAAR) 100 MG tablet Take 100 mg by mouth daily.    . Potassium Iodide 10 % SOLN by Does not apply route daily.    Marland Kitchen PROAIR HFA 108 (90 BASE) MCG/ACT inhaler     . triamterene-hydrochlorothiazide (DYAZIDE) 37.5-25 MG per capsule Take 1 capsule by mouth daily.     No current facility-administered medications on file prior to visit.     Past Medical History  Diagnosis Date  . Diabetes mellitus without complication   . Benign tumor of labia majora     a. resected/[facility].  . Chronic diastolic CHF (congestive heart failure)     a. 03/2013 Neg MV;  b. 10/2013 Echo: EF 65-70%, LVH, diast dysfxn, Triv MR/TR, mildly dil LA.  Marland Kitchen Hyperlipidemia   . Hypertension   . CKD (chronic  kidney disease), stage III      Past Surgical History  Procedure Laterality Date  . Hysterectomy    . Vulva surgery       Family History  Problem Relation Age of Onset  . Hypertension Mother   . Hypertension Father      History   Social History  . Marital Status: Married    Spouse Name: N/A  . Number of Children: N/A  . Years of Education: N/A   Occupational History  . Not on file.   Social History Main Topics  . Smoking status: Never Smoker   . Smokeless tobacco: Not on file  . Alcohol Use: No  . Drug Use: No    . Sexual Activity: Not on file   Other Topics Concern  . Not on file   Social History Narrative     PHYSICAL EXAM   BP 138/62 mmHg  Pulse 69  Ht 5' (1.524 m)  Wt 171 lb 8 oz (77.792 kg)  BMI 33.49 kg/m2 Constitutional: She is oriented to person, place, and time. She appears well-developed and well-nourished. No distress.  HENT: No nasal discharge.  Head: Normocephalic and atraumatic.  Eyes: Pupils are equal and round. Right eye exhibits no discharge. Left eye exhibits no discharge.  Neck: Normal range of motion. Neck supple. No JVD present. No thyromegaly present.  Cardiovascular: Normal rate, regular rhythm, normal heart sounds. Exam reveals no gallop and no friction rub. No murmur heard.  Pulmonary/Chest: Effort normal and breath sounds normal. No stridor. No respiratory distress. She has no wheezes. She has no rales. She exhibits no tenderness.  Abdominal: Soft. Bowel sounds are normal. She exhibits no distension. There is no tenderness. There is no rebound and no guarding.  Musculoskeletal: Normal range of motion. She exhibits no edema and no tenderness.  Neurological: She is alert and oriented to person, place, and time. Coordination normal.  Skin: Skin is warm and dry. No rash noted. She is not diaphoretic. No erythema. No pallor.  Psychiatric: She has a normal mood and affect. Her behavior is normal. Judgment and thought content normal.      ASSESSMENT AND PLAN

## 2015-01-31 NOTE — Patient Instructions (Signed)
Medication Instructions: 1) take lasix (furosemide) 40 mg one tablet by mouth once daily for weight gain of 2 lbs or more in 24 hours or 5 lbs total in a week.  Labwork: - none  Procedures/Testing: - none  Follow-Up: - Your physician wants you to follow-up in: 6 months with Dr. Fletcher Anon. You will receive a reminder letter in the mail two months in advance. If you don't receive a letter, please call our office to schedule the follow-up appointment.  Any Additional Special Instructions Will Be Listed Below (If Applicable). - none

## 2015-01-31 NOTE — Assessment & Plan Note (Signed)
Blood pressure is now well controlled on current medications. 

## 2015-03-23 ENCOUNTER — Ambulatory Visit: Payer: Self-pay | Admitting: Cardiovascular Disease

## 2015-08-01 ENCOUNTER — Ambulatory Visit: Payer: Medicare Other | Admitting: Cardiovascular Disease

## 2015-08-29 ENCOUNTER — Telehealth: Payer: Self-pay

## 2015-08-29 ENCOUNTER — Encounter: Payer: Self-pay | Admitting: Nurse Practitioner

## 2015-08-29 ENCOUNTER — Ambulatory Visit (INDEPENDENT_AMBULATORY_CARE_PROVIDER_SITE_OTHER): Payer: Medicare Other | Admitting: Nurse Practitioner

## 2015-08-29 ENCOUNTER — Telehealth: Payer: Self-pay | Admitting: Nurse Practitioner

## 2015-08-29 VITALS — BP 130/60 | HR 58 | Ht 60.0 in | Wt 178.5 lb

## 2015-08-29 DIAGNOSIS — N183 Chronic kidney disease, stage 3 unspecified: Secondary | ICD-10-CM

## 2015-08-29 DIAGNOSIS — I5032 Chronic diastolic (congestive) heart failure: Secondary | ICD-10-CM

## 2015-08-29 DIAGNOSIS — I10 Essential (primary) hypertension: Secondary | ICD-10-CM | POA: Diagnosis not present

## 2015-08-29 DIAGNOSIS — E785 Hyperlipidemia, unspecified: Secondary | ICD-10-CM | POA: Diagnosis not present

## 2015-08-29 DIAGNOSIS — E119 Type 2 diabetes mellitus without complications: Secondary | ICD-10-CM | POA: Insufficient documentation

## 2015-08-29 DIAGNOSIS — N1831 Chronic kidney disease, stage 3a: Secondary | ICD-10-CM | POA: Insufficient documentation

## 2015-08-29 NOTE — Telephone Encounter (Signed)
°  Patient said Gerald Stabs wanted to know what potassium she is taking at home.  He Rx from Dr. Rogelia Rohrer at [facility] is 300 mg potassium Chloride 20 meq/51ml take 1 tablespoon po daily w/ water

## 2015-08-29 NOTE — Telephone Encounter (Signed)
Lmtcb. Pt was to call the office to give verify the type of potassium solution and dosage she has at home, that was previously prescribed. Also reiterate with pt that Dyazide is to be d/c

## 2015-08-29 NOTE — Patient Instructions (Addendum)
Medication Instructions:  Your physician has recommended you make the following change in your medication:  1) RESUME Lasix 40mg  Twice daily for 3 days. THEN take Lasix 40mg  daily. 2) INCREASE Potassium to Twice daily for 3 days. Then take once daily 3) STOP Dyazide  Labwork: Your physician recommends that you return for lab work in:1 week (Bmet)   Testing/Procedures: None ordered  Follow-Up: Your physician recommends that you schedule a follow-up appointment in: 3 months with Dr.Arida   Any Other Special Instructions Will Be Listed Below (If Applicable). Please call the office to verify what type of potassium you are taking and the dosage     If you need a refill on your cardiac medications before your next appointment, please call your pharmacy.

## 2015-08-29 NOTE — Progress Notes (Signed)
Patient Name: Chelsea Davis Date of Encounter: 08/29/2015  Primary Care Provider:  Rogelia Rohrer, MD Primary Cardiologist:  Jerilynn Mages. Fletcher Anon, MD   Chief Complaint  79 year old female with a history of hypertension and diastolic heart failure who presents for follow-up.  Past Medical History   Past Medical History  Diagnosis Date  . Diabetes mellitus without complication ([place])   . Benign tumor of labia majora     a. resected/[facility].  . Chronic diastolic CHF (congestive heart failure) ([place])     a. 03/2013 Neg MV;  b. 10/2013 Echo: EF 65-70%, LVH, diast dysfxn, Triv MR/TR, mildly dil LA.  Marland Kitchen Hyperlipidemia   . Essential hypertension   . CKD (chronic kidney disease), stage III     a. 06/2013 Renal Duplex: nl renal arteries.   Past Surgical History  Procedure Laterality Date  . Hysterectomy    . Vulva surgery      Allergies  Allergies  Allergen Reactions  . Amlodipine     Other reaction(s): Unknown edema  . Coreg [Carvedilol]     Bradycardia   . Dye Fdc Red [Red Dye]   . Iodides Hives, Rash and Swelling  . Spironolactone Rash    HPI  79 year old female with a prior history of hypertension and diastolic heart failure  Dating back to 2014. She was last seen in clinic approximately 6 months ago. Since that time, she has done reasonably well. She is not particularly active but generally does not express dyspnea on exertion. She is not having any chest pain. In October, she was noted by her primary care provider to have a bump in her creatinine. She was advised to discontinue Lasix but was continued on triamterene and HCTZ therapy. Creatinine subsequently improved. Over the past month however, she has been experiencing increasing lower extremity edema, increasing abdominal girth, and about a 10 pound weight gain. She denies PND, orthopnea, dizziness, syncope, or early satiety. She gets almost 100% of her meals from [employer] and thus does not have much control over the sodium content of  her food.  Home Medications  Prior to Admission medications   Medication Sig Start Date End Date Taking? Authorizing Provider  Albuterol Sulfate (PROAIR HFA IN) Inhale into the lungs as needed.   Yes Historical Provider, MD  amLODipine (NORVASC) 10 MG tablet Take 10 mg by mouth daily.   Yes Historical Provider, MD  aspirin 81 MG tablet Take 81 mg by mouth daily.   Yes Historical Provider, MD  atorvastatin (LIPITOR) 20 MG tablet Take 20 mg by mouth daily.   Yes Historical Provider, MD  hydrALAZINE (APRESOLINE) 50 MG tablet Take 100 mg by mouth 3 (three) times daily.   Yes Historical Provider, MD  insulin NPH-regular Human (NOVOLIN 70/30) (70-30) 100 UNIT/ML injection Inject into the skin. Takes 30 units am and 12 units pm daily.   Yes Historical Provider, MD  lactulose (CHRONULAC) 10 GM/15ML solution Take by mouth as needed.    Yes Historical Provider, MD  losartan (COZAAR) 100 MG tablet Take 100 mg by mouth daily.   Yes Historical Provider, MD  Potassium Iodide 10 % SOLN by Does not apply route daily.   Yes Historical Provider, MD  triamterene-hydrochlorothiazide (DYAZIDE) 37.5-25 MG per capsule Take 1 capsule by mouth daily.   Yes Historical Provider, MD    Review of Systems  As above, approximately 10 pound weight gain with increasing lower extremity swelling and abdominal girth over the past month. No chest pain, dyspnea, PND,  orthopnea, dizziness, syncope, or early satiety..  All other systems reviewed and are otherwise negative except as noted above.  Physical Exam  VS:  BP 130/60 mmHg  Pulse 58  Ht 5' (1.524 m)  Wt 178 lb 8 oz (80.967 kg)  BMI 34.86 kg/m2 , BMI Body mass index is 34.86 kg/(m^2). GEN: Well nourished, well developed, in no acute distress. HEENT: normal. Neck: Supple, obese, difficult to assess JVP, no carotid bruits, or masses. Cardiac: RRR, no murmurs, rubs, or gallops. No clubbing, cyanosis, 1+ bilateral lower extremity edema to mid calf .  Radials/DP/PT 2+ and  equal bilaterally.  Respiratory:  Respirations regular and unlabored, clear to auscultation bilaterally. GI: Soft, nontender, nondistended, BS + x 4. MS: no deformity or atrophy. Skin: warm and dry, no rash. Neuro:  Strength and sensation are intact. Psych: Normal affect.  Accessory Clinical Findings  ECG - sinus bradycardia, 58, first-degree AV block, left axis deviation, left anterior fascicular block, intraventricular conduction delay, LVH with lateral T-wave inversion. No acute changes.  Assessment & Plan  1.  Acute on chronic diastolic congestive heart failure: Patient presents with a one-month history of increasing lower extremity edema and abdominal girth. Heart rate and blood pressure stable. Her weight is also up about 7 pounds on our scales she says it's more like 10 on hers. This has occurred since she was told to discontinue Lasix. She has continued to take triamterene HCTZ. I have asked her to discontinue triamterene HCTZ and we will resume Lasix. I've asked her to take 40 mg twice a day for the next 3 days and then drop back to 40 mg daily. She does have potassium at home but she is not sure of what the formulation is. We have potassium iodide listed in our medication list I suspect this is incorrect. She will call us today with her potassium dosage so that we can adjust this if necessary. Follow-up basic metabolic panel in 1 week.  2. Essential hypertension: Stable on current regimen.  3. Stage III chronic kidney disease: Showed recent worsening of renal function in the setting of both Lasix and triamterene HCTZ. Lasix was discontinued while triamterene HCTZ was continued. As above, in the setting of weight gain, I'm going to discontinue tracking HCTZ and resume Lasix. Follow-up basic metabolic panel next week.   4. Type 2 diabetes mellitus: Followed closely by primary care.  5. Hyperlipidemia: LDL was 81 in October with normal LFTs at that time. Continue current statin  dose.  6. Morbid obesity: She is not particularly active. She no longer drives. I've encouraged her to walk as tolerated.  7. Disposition: Follow-up basic metabolic panel in 1 week. We'll arrange for follow-up in approximately 3 months or sooner if necessary depending on her response to diuretic changes.    Murray Hodgkins, NP 08/29/2015, 2:09 PM

## 2015-08-30 ENCOUNTER — Other Ambulatory Visit: Payer: Self-pay

## 2015-09-04 NOTE — Telephone Encounter (Signed)
Medication list updated.

## 2015-09-05 ENCOUNTER — Other Ambulatory Visit (INDEPENDENT_AMBULATORY_CARE_PROVIDER_SITE_OTHER): Payer: Medicare Other | Admitting: *Deleted

## 2015-09-05 DIAGNOSIS — I10 Essential (primary) hypertension: Secondary | ICD-10-CM

## 2015-09-05 DIAGNOSIS — I5032 Chronic diastolic (congestive) heart failure: Secondary | ICD-10-CM

## 2015-09-06 ENCOUNTER — Other Ambulatory Visit: Payer: Self-pay

## 2015-09-06 DIAGNOSIS — N189 Chronic kidney disease, unspecified: Secondary | ICD-10-CM

## 2015-09-06 LAB — BASIC METABOLIC PANEL
BUN / CREAT RATIO: 20 (ref 11–26)
BUN: 31 mg/dL — ABNORMAL HIGH (ref 8–27)
CHLORIDE: 96 mmol/L — AB (ref 97–106)
CO2: 25 mmol/L (ref 18–29)
CREATININE: 1.52 mg/dL — AB (ref 0.57–1.00)
Calcium: 8.9 mg/dL (ref 8.7–10.3)
GFR calc Af Amer: 38 mL/min/{1.73_m2} — ABNORMAL LOW (ref >59)
GFR calc non Af Amer: 33 mL/min/{1.73_m2} — ABNORMAL LOW (ref >59)
Glucose: 198 mg/dL — ABNORMAL HIGH (ref 65–99)
Potassium: 5.3 mmol/L — ABNORMAL HIGH (ref 3.5–5.2)
Sodium: 140 mmol/L (ref 136–144)

## 2015-09-08 ENCOUNTER — Other Ambulatory Visit (INDEPENDENT_AMBULATORY_CARE_PROVIDER_SITE_OTHER): Payer: Medicare Other

## 2015-09-08 DIAGNOSIS — N189 Chronic kidney disease, unspecified: Secondary | ICD-10-CM

## 2015-09-09 LAB — BASIC METABOLIC PANEL
BUN/Creatinine Ratio: 16 (ref 11–26)
BUN: 21 mg/dL (ref 8–27)
CHLORIDE: 101 mmol/L (ref 97–106)
CO2: 27 mmol/L (ref 18–29)
Calcium: 9.1 mg/dL (ref 8.7–10.3)
Creatinine, Ser: 1.33 mg/dL — ABNORMAL HIGH (ref 0.57–1.00)
GFR calc Af Amer: 44 mL/min/{1.73_m2} — ABNORMAL LOW (ref >59)
GFR calc non Af Amer: 39 mL/min/{1.73_m2} — ABNORMAL LOW (ref >59)
Glucose: 154 mg/dL — ABNORMAL HIGH (ref 65–99)
Potassium: 3.1 mmol/L — ABNORMAL LOW (ref 3.5–5.2)
Sodium: 145 mmol/L — ABNORMAL HIGH (ref 136–144)

## 2015-09-28 NOTE — Telephone Encounter (Signed)
error 

## 2015-12-01 ENCOUNTER — Encounter: Payer: Self-pay | Admitting: Cardiovascular Disease

## 2015-12-01 ENCOUNTER — Ambulatory Visit (INDEPENDENT_AMBULATORY_CARE_PROVIDER_SITE_OTHER): Payer: Medicare Other | Admitting: Cardiovascular Disease

## 2015-12-01 VITALS — BP 132/60 | HR 64 | Ht 60.0 in | Wt 175.1 lb

## 2015-12-01 DIAGNOSIS — I10 Essential (primary) hypertension: Secondary | ICD-10-CM | POA: Diagnosis not present

## 2015-12-01 DIAGNOSIS — I5032 Chronic diastolic (congestive) heart failure: Secondary | ICD-10-CM | POA: Diagnosis not present

## 2015-12-01 NOTE — Assessment & Plan Note (Signed)
Blood pressure is well controlled on current medications. 

## 2015-12-01 NOTE — Assessment & Plan Note (Signed)
She appears to be euvolemic on current medications. She is currently on both furosemide and Dyazide. We have to be careful with her renal function and electrolytes. I requested basic metabolic profile today. If these come back stable, we can continue same regimen.  I had a prolonged discussion with her about the importance of low sodium diet which she has not been following.

## 2015-12-01 NOTE — Progress Notes (Signed)
Primary care physician: Dr. Rogelia Rohrer at [facility]  HPI  This is a 80 year old African American female who is here today for a followup visit regarding chronic diastolic heart failure and refractory hypertension.  Previous nuclear stress test showed no evidence of ischemia. Renal artery duplex in 2014 showed no evidence of renal artery stenosis.  She was hospitalized at [facility] in 10/2013 for acute on chronic diastolic heart failure.  Carvedilol was stopped due to bradycardia. Echo showed an EF of 65-70% with severe LVH and grade 1 diastolic dysfunction. LE venous duplex showed no DVT. Liver ultrasound showed no evidence of cirrhosis or abnormal portal veins. BNP was elevated. TnI was 0.4.   Her volume status is somewhat difficult to manage due to mild chronic kidney disease and narrow euvolemic window. She was seen a few months ago by Ignacia Bayley and was fluid overloaded. Furosemide was added. Dyazide was resumed by Dr. Alexandria Lodge due to concerns about refractory hypertension. She has been doing reasonably well. Her weight has been stable around 175 pounds.  The biggest issue is noncompliant with low sodium diet.    Allergies  Allergen Reactions  . Amlodipine     Other reaction(s): Unknown edema  . Coreg [Carvedilol]     Bradycardia   . Dye Fdc Red [Red Dye]   . Iodides Hives, Rash and Swelling  . Spironolactone Rash     Current Outpatient Prescriptions on File Prior to Visit  Medication Sig Dispense Refill  . Albuterol Sulfate (PROAIR HFA IN) Inhale 2 puffs into the lungs as needed (as needed for wheezing or shortness of breath).     Marland Kitchen amLODipine (NORVASC) 10 MG tablet Take 10 mg by mouth daily.    Marland Kitchen aspirin 81 MG tablet Take 81 mg by mouth daily.    Marland Kitchen atorvastatin (LIPITOR) 20 MG tablet Take 20 mg by mouth daily.    . hydrALAZINE (APRESOLINE) 50 MG tablet Take 100 mg by mouth 3 (three) times daily.    . insulin NPH-regular Human (NOVOLIN 70/30) (70-30) 100 UNIT/ML injection Inject into the  skin. Takes 30 units am and 12 units pm daily.    Marland Kitchen lactulose (CHRONULAC) 10 GM/15ML solution Take 10 g by mouth daily as needed for moderate constipation or severe constipation.     Marland Kitchen losartan (COZAAR) 100 MG tablet Take 100 mg by mouth daily.    . potassium chloride 20 MEQ/15ML (10%) SOLN Take 20 mEq by mouth daily.    . Potassium Iodide 10 % SOLN by Does not apply route daily.     No current facility-administered medications on file prior to visit.     Past Medical History  Diagnosis Date  . Diabetes mellitus without complication ([place])   . Benign tumor of labia majora     a. resected/[facility].  . Chronic diastolic CHF (congestive heart failure) ([place])     a. 03/2013 Neg MV;  b. 10/2013 Echo: EF 65-70%, LVH, diast dysfxn, Triv MR/TR, mildly dil LA.  Marland Kitchen Hyperlipidemia   . Essential hypertension   . CKD (chronic kidney disease), stage III     a. 06/2013 Renal Duplex: nl renal arteries.     Past Surgical History  Procedure Laterality Date  . Hysterectomy    . Vulva surgery       Family History  Problem Relation Age of Onset  . Hypertension Mother   . Hypertension Father      Social History   Social History  . Marital Status: Married  Spouse Name: N/A  . Number of Children: N/A  . Years of Education: N/A   Occupational History  . Not on file.   Social History Main Topics  . Smoking status: Never Smoker   . Smokeless tobacco: Not on file  . Alcohol Use: No  . Drug Use: No  . Sexual Activity: Not on file   Other Topics Concern  . Not on file   Social History Narrative     PHYSICAL EXAM   BP 132/60 mmHg  Pulse 64  Ht 5' (1.524 m)  Wt 175 lb 1.9 oz (79.434 kg)  BMI 34.20 kg/m2 Constitutional: She is oriented to person, place, and time. She appears well-developed and well-nourished. No distress.  HENT: No nasal discharge.  Head: Normocephalic and atraumatic.  Eyes: Pupils are equal and round. Right eye exhibits no discharge. Left eye exhibits no discharge.    Neck: Normal range of motion. Neck supple. No JVD present. No thyromegaly present.  Cardiovascular: Normal rate, regular rhythm, normal heart sounds. Exam reveals no gallop and no friction rub. No murmur heard.  Pulmonary/Chest: Effort normal and breath sounds normal. No stridor. No respiratory distress. She has no wheezes. She has no rales. She exhibits no tenderness.  Abdominal: Soft. Bowel sounds are normal. She exhibits no distension. There is no tenderness. There is no rebound and no guarding.  Musculoskeletal: Normal range of motion. She exhibits trace edema and no tenderness.  Neurological: She is alert and oriented to person, place, and time. Coordination normal.  Skin: Skin is warm and dry. No rash noted. She is not diaphoretic. No erythema. No pallor.  Psychiatric: She has a normal mood and affect. Her behavior is normal. Judgment and thought content normal.      ASSESSMENT AND PLAN

## 2015-12-01 NOTE — Patient Instructions (Addendum)
Medication Instructions:  Your physician recommends that you continue on your current medications as directed. Please refer to the Current Medication list given to you today.   Labwork: BMET  Testing/Procedures: none  Follow-Up: Your physician wants you to follow-up in: 4 months with Dr. Fletcher Anon.  You will receive a reminder letter in the mail two months in advance. If you don't receive a letter, please call our office to schedule the follow-up appointment.   Any Other Special Instructions Will Be Listed Below (If Applicable).     If you need a refill on your cardiac medications before your next appointment, please call your pharmacy.  Low-Sodium Eating Plan Sodium raises blood pressure and causes water to be held in the body. Getting less sodium from food will help lower your blood pressure, reduce any swelling, and protect your heart, liver, and kidneys. We get sodium by adding salt (sodium chloride) to food. Most of our sodium comes from canned, boxed, and frozen foods. Restaurant foods, fast foods, and pizza are also very high in sodium. Even if you take medicine to lower your blood pressure or to reduce fluid in your body, getting less sodium from your food is important. WHAT IS MY PLAN? Most people should limit their sodium intake to 2,300 mg a day.  WHAT DO I NEED TO KNOW ABOUT THIS EATING PLAN? For the low-sodium eating plan, you will follow these general guidelines:  Choose foods with a % Daily Value for sodium of less than 5% (as listed on the food label).   Use salt-free seasonings or herbs instead of table salt or sea salt.   Check with your health care provider or pharmacist before using salt substitutes.   Eat fresh foods.  Eat more vegetables and fruits.  Limit canned vegetables. If you do use them, rinse them well to decrease the sodium.   Limit cheese to 1 oz (28 g) per day.   Eat lower-sodium products, often labeled as "lower sodium" or "no salt  added."  Avoid foods that contain monosodium glutamate (MSG). MSG is sometimes added to [place] food and some canned foods.  Check food labels (Nutrition Facts labels) on foods to learn how much sodium is in one serving.  Eat more home-cooked food and less restaurant, buffet, and fast food.  When eating at a restaurant, ask that your food be prepared with less salt, or no salt if possible.  HOW DO I READ FOOD LABELS FOR SODIUM INFORMATION? The Nutrition Facts label lists the amount of sodium in one serving of the food. If you eat more than one serving, you must multiply the listed amount of sodium by the number of servings. Food labels may also identify foods as:  Sodium free--Less than 5 mg in a serving.  Very low sodium--35 mg or less in a serving.  Low sodium--140 mg or less in a serving.  Light in sodium--50% less sodium in a serving. For example, if a food that usually has 300 mg of sodium is changed to become light in sodium, it will have 150 mg of sodium.  Reduced sodium--25% less sodium in a serving. For example, if a food that usually has 400 mg of sodium is changed to reduced sodium, it will have 300 mg of sodium. WHAT FOODS CAN I EAT? Grains Low-sodium cereals, including oats, puffed wheat and rice, and shredded wheat cereals. Low-sodium crackers. Unsalted rice and pasta. Lower-sodium bread.  Vegetables Frozen or fresh vegetables. Low-sodium or reduced-sodium canned vegetables. Low-sodium or reduced-sodium  tomato sauce and paste. Low-sodium or reduced-sodium tomato and vegetable juices.  Fruits Fresh, frozen, and canned fruit. Fruit juice.  Meat and Other Protein Products Low-sodium canned tuna and salmon. Fresh or frozen meat, poultry, seafood, and fish. Lamb. Unsalted nuts. Dried beans, peas, and lentils without added salt. Unsalted canned beans. Homemade soups without salt. Eggs.  Dairy Milk. Soy milk. Ricotta cheese. Low-sodium or reduced-sodium cheeses.  Yogurt.  Condiments Fresh and dried herbs and spices. Salt-free seasonings. Onion and garlic powders. Low-sodium varieties of mustard and ketchup. Fresh or refrigerated horseradish. Lemon juice.  Fats and Oils Reduced-sodium salad dressings. Unsalted butter.  Other Unsalted popcorn and pretzels.  The items listed above may not be a complete list of recommended foods or beverages. Contact your dietitian for more options. WHAT FOODS ARE NOT RECOMMENDED? Grains Instant hot cereals. Bread stuffing, pancake, and biscuit mixes. Croutons. Seasoned rice or pasta mixes. Noodle soup cups. Boxed or frozen macaroni and cheese. Self-rising flour. Regular salted crackers. Vegetables Regular canned vegetables. Regular canned tomato sauce and paste. Regular tomato and vegetable juices. Frozen vegetables in sauces. Salted [place] fries. Olives. Angie Fava. Relishes. Sauerkraut. Salsa. Meat and Other Protein Products Salted, canned, smoked, spiced, or pickled meats, seafood, or fish. Bacon, ham, sausage, hot dogs, corned beef, chipped beef, and packaged luncheon meats. Salt pork. Jerky. Pickled herring. Anchovies, regular canned tuna, and sardines. Salted nuts. Dairy Processed cheese and cheese spreads. Cheese curds. Blue cheese and cottage cheese. Buttermilk.  Condiments Onion and garlic salt, seasoned salt, table salt, and sea salt. Canned and packaged gravies. Worcestershire sauce. Tartar sauce. Barbecue sauce. Teriyaki sauce. Soy sauce, including reduced sodium. Steak sauce. Fish sauce. Oyster sauce. Cocktail sauce. Horseradish that you find on the shelf. Regular ketchup and mustard. Meat flavorings and tenderizers. Bouillon cubes. Hot sauce. Tabasco sauce. Marinades. Taco seasonings. Relishes. Fats and Oils Regular salad dressings. Salted butter. Margarine. Ghee. Bacon fat.  Other Potato and tortilla chips. Corn chips and puffs. Salted popcorn and pretzels. Canned or dried soups. Pizza. Frozen  entrees and pot pies.  The items listed above may not be a complete list of foods and beverages to avoid. Contact your dietitian for more information.   This information is not intended to replace advice given to you by your health care provider. Make sure you discuss any questions you have with your health care provider.   Document Released: 03/15/2002 Document Revised: 10/14/2014 Document Reviewed: 07/28/2013 Elsevier Interactive Patient Education [employer].

## 2015-12-02 LAB — BASIC METABOLIC PANEL
BUN/Creatinine Ratio: 18 (ref 11–26)
BUN: 26 mg/dL (ref 8–27)
CALCIUM: 9.7 mg/dL (ref 8.7–10.3)
CHLORIDE: 98 mmol/L (ref 96–106)
CO2: 23 mmol/L (ref 18–29)
Creatinine, Ser: 1.42 mg/dL — ABNORMAL HIGH (ref 0.57–1.00)
GFR calc Af Amer: 41 mL/min/{1.73_m2} — ABNORMAL LOW (ref >59)
GFR, EST NON AFRICAN AMERICAN: 36 mL/min/{1.73_m2} — AB (ref >59)
Glucose: 144 mg/dL — ABNORMAL HIGH (ref 65–99)
Potassium: 4 mmol/L (ref 3.5–5.2)
SODIUM: 141 mmol/L (ref 134–144)

## 2016-02-12 DIAGNOSIS — E1165 Type 2 diabetes mellitus with hyperglycemia: Secondary | ICD-10-CM | POA: Insufficient documentation

## 2016-03-29 ENCOUNTER — Ambulatory Visit: Payer: Medicare Other | Admitting: Cardiovascular Disease

## 2016-04-19 ENCOUNTER — Encounter: Payer: Self-pay | Admitting: Cardiovascular Disease

## 2016-04-19 ENCOUNTER — Ambulatory Visit (INDEPENDENT_AMBULATORY_CARE_PROVIDER_SITE_OTHER): Payer: Medicare Other | Admitting: Cardiovascular Disease

## 2016-04-19 VITALS — BP 128/50 | HR 61 | Ht 63.0 in | Wt 172.0 lb

## 2016-04-19 DIAGNOSIS — I10 Essential (primary) hypertension: Secondary | ICD-10-CM | POA: Diagnosis not present

## 2016-04-19 DIAGNOSIS — I5032 Chronic diastolic (congestive) heart failure: Secondary | ICD-10-CM

## 2016-04-19 MED ORDER — FUROSEMIDE 20 MG PO TABS: 20.0000 mg | ORAL_TABLET | Freq: Every day | ORAL | Status: DC

## 2016-04-19 NOTE — Progress Notes (Signed)
Cardiology Office Note   Date:  04/19/2016   ID:  Chelsea Davis, DOB 10/24/1935, MRN [id number]  PCP:  Rogelia Rohrer, MD  Cardiologist:   Kathlyn Sacramento, MD   Chief Complaint  Patient presents with  . other    4 month follow up. meds reviewed by the patient's bottles. "doing well."       History of Present Illness: ANNETE Davis is a 80 y.o. female who presents for  a followup visit regarding chronic diastolic heart failure and refractory hypertension. She has chronic kidney disease  and insulin-requiring diabetes. Previous nuclear stress test showed no evidence of ischemia. Renal artery duplex in 2014 showed no evidence of renal artery stenosis.  She was hospitalized at [facility] in 10/2013 for acute on chronic diastolic heart failure.  Carvedilol was stopped due to bradycardia. Echo showed an EF of 65-70% with severe LVH and grade 1 diastolic dysfunction. LE venous duplex showed no DVT. Liver ultrasound showed no evidence of cirrhosis or abnormal portal veins.   Her volume status is somewhat difficult to manage due to mild chronic kidney disease and narrow euvolemic window.  Labs in February showed stable kidney function with a creatinine of 1.42 and normal electrolytes. However, repeat labs in May in [facility] showed a creatinine of 2.1 with a BUN of 40. The patient lost 3 pounds since last visit. She reports stable shortness of breath and edema. Her diabetes continues to be uncontrolled with most recent hemoglobin A1c of 11. She denies any chest pain.  Past Medical History  Diagnosis Date  . Diabetes mellitus without complication ([place])   . Benign tumor of labia majora     a. resected/[facility].  . Chronic diastolic CHF (congestive heart failure) ([place])     a. 03/2013 Neg MV;  b. 10/2013 Echo: EF 65-70%, LVH, diast dysfxn, Triv MR/TR, mildly dil LA.  Marland Kitchen Hyperlipidemia   . Essential hypertension   . CKD (chronic kidney disease), stage III     a. 06/2013 Renal Duplex: nl renal arteries.    Past  Surgical History  Procedure Laterality Date  . Hysterectomy    . Vulva surgery       Current Outpatient Prescriptions  Medication Sig Dispense Refill  . Albuterol Sulfate (PROAIR HFA IN) Inhale 2 puffs into the lungs as needed (as needed for wheezing or shortness of breath).     Marland Kitchen amLODipine (NORVASC) 10 MG tablet Take 10 mg by mouth daily.    Marland Kitchen aspirin 81 MG tablet Take 81 mg by mouth daily.    Marland Kitchen atorvastatin (LIPITOR) 20 MG tablet Take 20 mg by mouth daily.    . furosemide (LASIX) 20 MG tablet Take 1 tablet (20 mg total) by mouth daily. 30 tablet 5  . hydrALAZINE (APRESOLINE) 50 MG tablet Take 100 mg by mouth 3 (three) times daily.    . insulin NPH-regular Human (NOVOLIN 70/30) (70-30) 100 UNIT/ML injection Inject into the skin. Takes 30 units am and 12 units pm daily.    Marland Kitchen lactulose (CHRONULAC) 10 GM/15ML solution Take 10 g by mouth daily as needed for moderate constipation or severe constipation.     Marland Kitchen losartan (COZAAR) 100 MG tablet Take 100 mg by mouth daily.    . potassium chloride 20 MEQ/15ML (10%) SOLN Take 20 mEq by mouth daily.    Marland Kitchen triamterene-hydrochlorothiazide (DYAZIDE) 37.5-25 MG capsule Take 2 capsules by mouth daily.  11   No current facility-administered medications for this visit.    Allergies:  Amlodipine; Coreg; Dye fdc red; Iodides; and Spironolactone    Social History:  The patient  reports that she has never smoked. She does not have any smokeless tobacco history on file. She reports that she does not drink alcohol or use illicit drugs.   Family History:  The patient's family history includes Hypertension in her father and mother.    ROS:  Please see the history of present illness.   Otherwise, review of systems are positive for none.   All other systems are reviewed and negative.    PHYSICAL EXAM: VS:  BP 128/50 mmHg  Pulse 61  Ht 5\' 3"  (1.6 m)  Wt 172 lb (78.019 kg)  BMI 30.48 kg/m2 , BMI Body mass index is 30.48 kg/(m^2). GEN: Well nourished, well  developed, in no acute distress HEENT: normal Neck: no JVD, carotid bruits, or masses Cardiac: RRR; no murmurs, rubs, or gallops, trace edema  Respiratory:  clear to auscultation bilaterally, normal work of breathing GI: soft, nontender, nondistended, + BS MS: no deformity or atrophy Skin: warm and dry, no rash Neuro:  Strength and sensation are intact Psych: euthymic mood, full affect   EKG:  EKG is ordered today. The ekg ordered today demonstrates sinus rhythm with first-degree AV block and left bundle branch block.   Recent Labs: 12/01/2015: BUN 26; Creatinine, Ser 1.42*; Potassium 4.0; Sodium 141    Lipid Panel    Component Value Date/Time   CHOL 138 03/25/2013 1320   TRIG 85 03/25/2013 1320   HDL 51 03/25/2013 1320   VLDL 17 03/25/2013 1320   LDLCALC 70 03/25/2013 1320      Wt Readings from Last 3 Encounters:  04/19/16 172 lb (78.019 kg)  12/01/15 175 lb 1.9 oz (79.434 kg)  08/29/15 178 lb 8 oz (80.967 kg)       ASSESSMENT AND PLAN:  1.  Chronic diastolic heart failure: I'm concerned about the patient's worsening renal function with most recent creatinine of 2.1. BUN was 40. Weight gradually decreased from 178 pounds 172 pounds. She seems to be volume depleted. This is probably due to the combination of a thiazide diuretic and furosemide. I decreased the dose of furosemide today to 20 mg once daily.  2. Essential hypertension: Blood pressure is controlled on current medications.  3. Uncontrolled diabetes: I advised her to follow-up with her primary care physician for better diabetes control. Most recent hemoglobin A1c was 11.  4. Chronic kidney disease: Recent worsening is likely due to volume depletion. She probably has underlying diabetic nephropathy. The dose of furosemide was decreased as outlined above.   Disposition:   FU with me in 6 months  Signed,  Kathlyn Sacramento, MD  04/19/2016 2:15 PM    [facility]

## 2016-04-19 NOTE — Patient Instructions (Signed)
Medication Instructions:  Your physician has recommended you make the following change in your medication:  DECREASE lasix 20mg  once daily   Labwork: none  Testing/Procedures: none  Follow-Up: Your physician wants you to follow-up in: six months with Dr. Fletcher Anon.  You will receive a reminder letter in the mail two months in advance. If you don't receive a letter, please call our office to schedule the follow-up appointment.   Any Other Special Instructions Will Be Listed Below (If Applicable).     If you need a refill on your cardiac medications before your next appointment, please call your pharmacy.

## 2016-10-18 ENCOUNTER — Encounter: Payer: Self-pay | Admitting: Cardiovascular Disease

## 2016-10-18 ENCOUNTER — Ambulatory Visit (INDEPENDENT_AMBULATORY_CARE_PROVIDER_SITE_OTHER): Payer: Medicare Other | Admitting: Cardiovascular Disease

## 2016-10-18 VITALS — BP 120/50 | HR 57 | Ht 65.0 in | Wt 177.5 lb

## 2016-10-18 DIAGNOSIS — I5032 Chronic diastolic (congestive) heart failure: Secondary | ICD-10-CM

## 2016-10-18 DIAGNOSIS — I10 Essential (primary) hypertension: Secondary | ICD-10-CM

## 2016-10-18 NOTE — Patient Instructions (Signed)
Medication Instructions: Continue same medications.   Labwork: None.   Procedures/Testing: None.   Follow-Up: 6 months with Dr. Angeligue Bowne.   Any Additional Special Instructions Will Be Listed Below (If Applicable).     If you need a refill on your cardiac medications before your next appointment, please call your pharmacy.   

## 2016-10-18 NOTE — Progress Notes (Signed)
Cardiology Office Note   Date:  10/18/2016   ID:  Chelsea Davis, DOB Mar 31, 1936, MRN [id number]  PCP:  Rogelia Rohrer, MD  Cardiologist:   Kathlyn Sacramento, MD   Chief Complaint  Patient presents with  . OTHER    6 month f/u c/o edema. Meds reviewed verbally with pt.      History of Present Illness: Chelsea Davis is a 81 y.o. female who presents for  a followup visit regarding chronic diastolic heart failure and refractory hypertension. She has chronic kidney disease  and insulin-requiring diabetes. Previous nuclear stress test showed no evidence of ischemia. Renal artery duplex in 2014 showed no evidence of renal artery stenosis.  She was hospitalized at [facility] in 10/2013 for acute on chronic diastolic heart failure.  Carvedilol was stopped due to bradycardia. Echo showed an EF of 65-70% with severe LVH and grade 1 diastolic dysfunction. LE venous duplex showed no DVT. Liver ultrasound showed no evidence of cirrhosis or abnormal portal veins.   During last visit, I decreased the dose of furosemide to 20 mg once daily after her labs showed worsening renal function with a creatinine of 2.1 and BUN of 40. Most recent labs in December were reviewed and showed improvement in creatinine to 1.5. Her diabetes control has also improved significantly with a hemoglobin A1c down to 7.7 from 11. She is doing well overall with no chest pain. She has stable exertional dyspnea. She continues to be bradycardic but overall with no syncope or presyncope.  Past Medical History:  Diagnosis Date  . Benign tumor of labia majora    a. resected/[facility].  . Chronic diastolic CHF (congestive heart failure) ([place])    a. 03/2013 Neg MV;  b. 10/2013 Echo: EF 65-70%, LVH, diast dysfxn, Triv MR/TR, mildly dil LA.  . CKD (chronic kidney disease), stage III    a. 06/2013 Renal Duplex: nl renal arteries.  . Diabetes mellitus without complication ([place])   . Essential hypertension   . Hyperlipidemia     Past Surgical History:    Procedure Laterality Date  . hysterectomy    . VULVA SURGERY       Current Outpatient Prescriptions  Medication Sig Dispense Refill  . Albuterol Sulfate (PROAIR HFA IN) Inhale 2 puffs into the lungs as needed (as needed for wheezing or shortness of breath).     Marland Kitchen amLODipine (NORVASC) 10 MG tablet Take 10 mg by mouth daily.    Marland Kitchen aspirin 81 MG tablet Take 81 mg by mouth daily.    Marland Kitchen atorvastatin (LIPITOR) 20 MG tablet Take 20 mg by mouth daily.    . furosemide (LASIX) 20 MG tablet Take 1 tablet (20 mg total) by mouth daily. 30 tablet 5  . hydrALAZINE (APRESOLINE) 50 MG tablet Take 100 mg by mouth 3 (three) times daily.    . insulin NPH-regular Human (NOVOLIN 70/30) (70-30) 100 UNIT/ML injection Inject into the skin. Takes 30 units am and 12 units pm daily.    Marland Kitchen lactulose (CHRONULAC) 10 GM/15ML solution Take 10 g by mouth daily as needed for moderate constipation or severe constipation.     Marland Kitchen losartan (COZAAR) 100 MG tablet Take 100 mg by mouth daily.    . potassium chloride 20 MEQ/15ML (10%) SOLN Take 20 mEq by mouth daily.    Marland Kitchen triamterene-hydrochlorothiazide (DYAZIDE) 37.5-25 MG capsule Take 2 capsules by mouth daily.  11   No current facility-administered medications for this visit.     Allergies:   Amlodipine; Coreg [  carvedilol]; Dye fdc red [red dye]; Iodides; and Spironolactone    Social History:  The patient  reports that she has never smoked. She has never used smokeless tobacco. She reports that she does not drink alcohol or use drugs.   Family History:  The patient's family history includes Hypertension in her father and mother.    ROS:  Please see the history of present illness.   Otherwise, review of systems are positive for none.   All other systems are reviewed and negative.    PHYSICAL EXAM: VS:  BP (!) 120/50 (BP Location: Left Arm, Patient Position: Sitting, Cuff Size: Normal)   Pulse (!) 57   Ht 5\' 5"  (1.651 m)   Wt 177 lb 8 oz (80.5 kg)   BMI 29.54 kg/m  ,  BMI Body mass index is 29.54 kg/m. GEN: Well nourished, well developed, in no acute distress HEENT: normal Neck: no JVD, carotid bruits, or masses Cardiac: RRR; no murmurs, rubs, or gallops, trace edema  Respiratory:  clear to auscultation bilaterally, normal work of breathing GI: soft, nontender, nondistended, + BS MS: no deformity or atrophy Skin: warm and dry, no rash Neuro:  Strength and sensation are intact Psych: euthymic mood, full affect   EKG:  EKG is ordered today. The ekg ordered today demonstrates sinus bradycardia with first-degree AV block and left bundle branch block.   Recent Labs: 12/01/2015: BUN 26; Creatinine, Ser 1.42; Potassium 4.0; Sodium 141    Lipid Panel    Component Value Date/Time   CHOL 138 03/25/2013 1320   TRIG 85 03/25/2013 1320   HDL 51 03/25/2013 1320   VLDL 17 03/25/2013 1320   LDLCALC 70 03/25/2013 1320      Wt Readings from Last 3 Encounters:  10/18/16 177 lb 8 oz (80.5 kg)  04/19/16 172 lb (78 kg)  12/01/15 175 lb 1.9 oz (79.4 kg)       ASSESSMENT AND PLAN:  1.  Chronic diastolic heart failure:  The patient appears to be euvolemic on small dose furosemide. Her weight has been relatively stable. Renal function improved after decreasing the dose of Lasix. I made no changes today.  2. Essential hypertension: Blood pressure is controlled on current medications.  3.  Asymptomatic bradycardia: Continue to monitor closely as she does have underlying first-degree AV block and left bundle branch block.   Disposition:   FU with me in 6 months  Signed,  Kathlyn Sacramento, MD  10/18/2016 1:57 PM    [place]

## 2017-06-19 ENCOUNTER — Ambulatory Visit: Payer: Medicare Other | Admitting: Cardiovascular Disease

## 2017-07-23 NOTE — Progress Notes (Signed)
Cardiology Office Note Date:  07/24/2017  Patient ID:  Chelsea Davis 1936/07/27, MRN [id number] PCP:  Rogelia Rohrer, MD  Cardiologist:  Dr. Fletcher Anon, MD    Chief Complaint: Follow up diastolic CHF and HTN  History of Present Illness: Chelsea Davis is a 81 y.o. 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  Previous nuclear stress test in 03/2013, showed no evidence of ischemia. Renal artery ultrasound in 2014 showed no evidence of RAS. Admitted to [facility] in 10/2013 for acute on chronic diastolic CHF. Coreg was held 2/2 bradycardia. Echo 10/2013, showed hyperdynamic LVSF with an EF of 65-70%, severe LVH, Gr1DD. Lower extremity ultrasound was negative for DVT. Liver ultrasound showed no evidence of cirrhosis or abnormal portal veins.   Has had to previously hold or decrease Lasix due to elevations in SCr/BUN. Most recently, in 04/2016, her Lasix was decreased to 20 mg daily after her labs drawn at [facility] in 02/2016 showed worsening renal function with a SCr of 2.1 and BUN of 43. She was seen by Dr. Fletcher Anon in 10/2016, and doing overall well with stable exertional dyspnea and asymptomatic bradycardia. She was most recently seen by her PCP in late September, 2018 for follow up and doing well at that time. I do not have more recent labs for review than the above. It is noted in [facility] PCP note from 07/04/2017, the patient's renal function is stable.   She is doing well today. Tolerating all medications without issues. Weight stable at home. No chest pain or SOB. Intermittent lower extremity swelling is her main complaint today. Reports legs will swell when standing for extended time periods and improve when laying down at nighttime. Has also noted 2-pillow orthopnea over the past 4-6 weeks. No early satiety or  cough.    Past Medical History:  Diagnosis Date  . Benign tumor of labia majora    a. resected/[facility].  . Chronic diastolic CHF (congestive heart failure) ([place])    a. 03/2013 Neg MV;  b. 10/2013 Echo: EF 65-70%, LVH, diast dysfxn, Triv MR/TR, mildly dil LA.  . CKD (chronic kidney disease), stage III ([place])    a. 06/2013 Renal Duplex: nl renal arteries.  . Diabetes mellitus without complication ([place])   . Essential hypertension   . Hyperlipidemia     Past Surgical History:  Procedure Laterality Date  . hysterectomy    . VULVA SURGERY      Current Meds  Medication Sig  . Albuterol Sulfate (PROAIR HFA IN) Inhale 2 puffs into the lungs as needed (as needed for wheezing or shortness of breath).   Marland Kitchen amLODipine (NORVASC) 10 MG tablet Take 10 mg by mouth daily.  Marland Kitchen aspirin 81 MG tablet Take 81 mg by mouth daily.  Marland Kitchen atorvastatin (LIPITOR) 20 MG tablet Take 20 mg by mouth daily.  . furosemide (LASIX) 20 MG tablet Take 1 tablet (20 mg total) by mouth daily.  . hydrALAZINE (APRESOLINE) 50 MG tablet Take 100 mg by mouth 3 (three) times daily.  . insulin NPH-regular Human (NOVOLIN 70/30) (70-30) 100 UNIT/ML injection Inject into the skin. Takes 30 units am and 12 units pm daily.  Marland Kitchen lactulose (CHRONULAC) 10 GM/15ML solution Take 10 g by mouth daily as needed for moderate constipation or severe constipation.   Marland Kitchen losartan (COZAAR) 100 MG  tablet Take 100 mg by mouth daily.  . potassium chloride 20 MEQ/15ML (10%) SOLN Take 20 mEq by mouth daily.  Marland Kitchen triamterene-hydrochlorothiazide (DYAZIDE) 37.5-25 MG capsule Take 2 capsules by mouth daily.    Allergies:   Amlodipine; Coreg [carvedilol]; Dye fdc red [red dye]; Iodides; and Spironolactone   Social History:  The patient  reports that she has never smoked. She has never used smokeless tobacco. She reports that she does not drink alcohol or use drugs.   Family History:  The patient's family history includes Hypertension in her father and mother.  ROS:     Review of Systems  Constitutional: Positive for malaise/fatigue. Negative for chills, diaphoresis, fever and weight loss.  HENT: Negative for congestion.   Eyes: Negative for discharge and redness.  Respiratory: Negative for cough, hemoptysis, sputum production, shortness of breath and wheezing.   Cardiovascular: Positive for orthopnea and leg swelling. Negative for chest pain, palpitations, claudication and PND.  Gastrointestinal: Negative for abdominal pain, blood in stool, heartburn, melena, nausea and vomiting.  Genitourinary: Negative for hematuria.  Musculoskeletal: Negative for falls and myalgias.  Skin: Negative for rash.  Neurological: Negative for dizziness, tingling, tremors, sensory change, speech change, focal weakness, loss of consciousness and weakness.  Endo/Heme/Allergies: Does not bruise/bleed easily.  Psychiatric/Behavioral: Negative for substance abuse. The patient is not nervous/anxious.   All other systems reviewed and are negative.    PHYSICAL EXAM:  VS:  BP 111/60 (BP Location: Left Arm, Patient Position: Sitting, Cuff Size: Normal)   Pulse (!) 59   Ht 5' (1.524 m)   Wt 173 lb 4 oz (78.6 kg)   BMI 33.84 kg/m  BMI: Body mass index is 33.84 kg/m.  Physical Exam  Constitutional: She is oriented to person, place, and time. She appears well-developed and well-nourished.  HENT:  Head: Normocephalic and atraumatic.  Eyes: Right eye exhibits no discharge. Left eye exhibits no discharge.  Neck: Normal range of motion. No JVD present.  Cardiovascular: Normal rate, regular rhythm, S1 normal, S2 normal and normal heart sounds.  Exam reveals no distant heart sounds, no friction rub, no midsystolic click and no opening snap.   No murmur heard. Pulses:      Posterior tibial pulses are 2+ on the right side, and 2+ on the left side.  Pulmonary/Chest: Effort normal and breath sounds normal. No respiratory distress. She has no decreased breath sounds. She has no wheezes. She  has no rales. She exhibits no tenderness.  Abdominal: Soft. She exhibits no distension. There is no tenderness.  Musculoskeletal: She exhibits edema.  Trace bilateral pre-tibial edema.   Neurological: She is alert and oriented to person, place, and time.  Skin: Skin is warm and dry. No cyanosis. Nails show no clubbing.  Psychiatric: She has a normal mood and affect. Her speech is normal and behavior is normal. Judgment and thought content normal.     EKG:  Was ordered and interpreted by me today. Shows sinus bradycardia, 59 bpm, left axis deviation, 1st degree AV block, LBBB  Recent Labs: Per HPI.  Wt Readings from Last 3 Encounters:  07/24/17 173 lb 4 oz (78.6 kg)  10/18/16 177 lb 8 oz (80.5 kg)  04/19/16 172 lb (78 kg)     Other studies reviewed: Additional studies/records reviewed today include: summarized above  ASSESSMENT AND PLAN:  1. Chronic diastolic CHF: She does not appear volume up at this time. Weight is down 4 pounds from 10/2016 office visit. She does note 2-pillow orthopnea now,  which is new over the past 4-6 weeks. Check echo. Continue Lasix 20 mg daily, increases in the past have led to AKI. She continues to eat out at restaurants on the weekends, cessation is advised. Daily weights. CHF education.   2. Lower extremity swelling: She notes this is intermittent and improves when supine. Suspect this is multifactorial. I will decrease her amlodipine to 5 mg daily. Consider compression stockings. Continue Lasix.   3. Essential hypertension: BP well controlled today. She has amlodipine listed as a medication allergy/intolerance though she has been taking this medication without issues. Decrease amlodipine as above given lower extremity swelling. Continue hydralazine, losartan, and Dyazide.   4. Asymptomatic bradycardia: Not on beta blocker. Stable.   5. CKD stage II-III: SCr stable per PCP note. Given she reports blood was drawn in late September I will not repeat at this  time.   6. HLD: Lipitor. Per PCP note 07/04/2017, "LDL looks good."  Disposition: F/u with Dr. Fletcher Anon, MD in 3-6 months, sooner if indicated by echo.   Current medicines are reviewed at length with the patient today.  The patient did not have any concerns regarding medicines.  Melvern Banker PA-C 07/24/2017 2:13 PM     [address], [place] [address] [phone]

## 2017-07-24 ENCOUNTER — Ambulatory Visit (INDEPENDENT_AMBULATORY_CARE_PROVIDER_SITE_OTHER): Payer: Medicare Other | Admitting: Physician Assistant

## 2017-07-24 ENCOUNTER — Encounter: Payer: Self-pay | Admitting: Physician Assistant

## 2017-07-24 VITALS — BP 111/60 | HR 59 | Ht 60.0 in | Wt 173.2 lb

## 2017-07-24 DIAGNOSIS — I10 Essential (primary) hypertension: Secondary | ICD-10-CM

## 2017-07-24 DIAGNOSIS — N182 Chronic kidney disease, stage 2 (mild): Secondary | ICD-10-CM | POA: Diagnosis not present

## 2017-07-24 DIAGNOSIS — R0602 Shortness of breath: Secondary | ICD-10-CM | POA: Diagnosis not present

## 2017-07-24 DIAGNOSIS — M7989 Other specified soft tissue disorders: Secondary | ICD-10-CM

## 2017-07-24 DIAGNOSIS — R001 Bradycardia, unspecified: Secondary | ICD-10-CM | POA: Diagnosis not present

## 2017-07-24 DIAGNOSIS — I5032 Chronic diastolic (congestive) heart failure: Secondary | ICD-10-CM

## 2017-07-24 MED ORDER — AMLODIPINE BESYLATE 5 MG PO TABS: 5.0000 mg | ORAL_TABLET | Freq: Every day | ORAL | 3 refills | Status: DC

## 2017-07-24 NOTE — Patient Instructions (Addendum)
Medication Instructions:  Your physician has recommended you make the following change in your medication:  DECREASE amlodipine to 5mg  once daily   Labwork: none  Testing/Procedures: Your physician has requested that you have an echocardiogram. Echocardiography is a painless test that uses sound waves to create images of your heart. It provides your doctor with information about the size and shape of your heart and how well your heart's chambers and valves are working. This procedure takes approximately one hour. There are no restrictions for this procedure.    Follow-Up: Your physician recommends that you schedule a follow-up appointment in: 3-6 months with Christell Faith, PA-C or Dr. Fletcher Anon.    Any Other Special Instructions Will Be Listed Below (If Applicable).     If you need a refill on your cardiac medications before your next appointment, please call your pharmacy.  Echocardiogram An echocardiogram, or echocardiography, uses sound waves (ultrasound) to produce an image of your heart. The echocardiogram is simple, painless, obtained within a short period of time, and offers valuable information to your health care provider. The images from an echocardiogram can provide information such as:  Evidence of coronary artery disease (CAD).  Heart size.  Heart muscle function.  Heart valve function.  Aneurysm detection.  Evidence of a past heart attack.  Fluid buildup around the heart.  Heart muscle thickening.  Assess heart valve function.  Tell a health care provider about:  Any allergies you have.  All medicines you are taking, including vitamins, herbs, eye drops, creams, and over-the-counter medicines.  Any problems you or family members have had with anesthetic medicines.  Any blood disorders you have.  Any surgeries you have had.  Any medical conditions you have.  Whether you are pregnant or may be pregnant. What happens before the procedure? No special  preparation is needed. Eat and drink normally. What happens during the procedure?  In order to produce an image of your heart, gel will be applied to your chest and a wand-like tool (transducer) will be moved over your chest. The gel will help transmit the sound waves from the transducer. The sound waves will harmlessly bounce off your heart to allow the heart images to be captured in real-time motion. These images will then be recorded.  You may need an IV to receive a medicine that improves the quality of the pictures. What happens after the procedure? You may return to your normal schedule including diet, activities, and medicines, unless your health care provider tells you otherwise. This information is not intended to replace advice given to you by your health care provider. Make sure you discuss any questions you have with your health care provider. Document Released: 09/20/2000 Document Revised: 05/11/2016 Document Reviewed: 05/31/2013 Elsevier Interactive Patient Education  2017 [employer].

## 2017-07-30 ENCOUNTER — Other Ambulatory Visit: Payer: Self-pay

## 2017-07-30 ENCOUNTER — Ambulatory Visit (INDEPENDENT_AMBULATORY_CARE_PROVIDER_SITE_OTHER): Payer: Medicare Other

## 2017-07-30 DIAGNOSIS — R0602 Shortness of breath: Secondary | ICD-10-CM

## 2017-07-30 LAB — ECHOCARDIOGRAM COMPLETE
AO mean calculated velocity dopler: 108 cm/s
AOASC: 28 cm
AV Area VTI index: 1.52 cm2/m2
AV Mean grad: 6 mmHg
AV vel: 2.67
AVAREAMEANV: 2.63 cm2
AVAREAMEANVIN: 1.49 cm2/m2
AVCELMEANRAT: 0.93
Area-P 1/2: 2.62 cm2
E decel time: 285 msec
E/e' ratio: 21.56
FS: 41 % (ref 28–44)
IVS/LV PW RATIO, ED: 1.25
LA diam end sys: 39 mm
LA diam index: 2.22 cm/m2
LA vol index: 29.4 mL/m2
LA vol: 51.8 mL
LASIZE: 39 mm
LAVOLA4C: 60.6 mL
LDCA: 2.84 cm2
LV E/e'average: 21.56
LV PW d: 20 mm — AB (ref 0.6–1.1)
LV TDI E'LATERAL: 4.5
LV e' LATERAL: 4.5 cm/s
LVEEMED: 21.56
LVOT VTI: 32 cm
LVOT diameter: 19 mm
LVOTSV: 91 mL
LVOTVTI: 0.94 cm
MV Dec: 285
MV Peak grad: 4 mmHg
MVPKAVEL: 127 m/s
MVPKEVEL: 97 m/s
P 1/2 time: 84 ms
RV LATERAL S' VELOCITY: 11 cm/s
RV TAPSE: 23.9 mm
TDI e' medial: 2.78
VTI: 34 cm
Valve area index: 1.52
Valve area: 2.67 cm2

## 2017-10-31 ENCOUNTER — Ambulatory Visit: Payer: Medicare Other | Admitting: Cardiovascular Disease

## 2017-10-31 ENCOUNTER — Encounter: Payer: Self-pay | Admitting: Cardiovascular Disease

## 2017-10-31 VITALS — BP 110/50 | HR 63 | Ht 65.0 in | Wt 172.5 lb

## 2017-10-31 DIAGNOSIS — I5032 Chronic diastolic (congestive) heart failure: Secondary | ICD-10-CM | POA: Diagnosis not present

## 2017-10-31 DIAGNOSIS — I10 Essential (primary) hypertension: Secondary | ICD-10-CM

## 2017-10-31 NOTE — Progress Notes (Signed)
Cardiology Office Note   Date:  10/31/2017   ID:  Chelsea Davis, DOB 12/16/1935, MRN [id number]  PCP:  Rogelia Rohrer, MD  Cardiologist:   Kathlyn Sacramento, MD   Chief Complaint  Patient presents with  . other    3 month follow up. Meds reviewed by the pt.'s bottles. Pt. c/o shortness of breath, LE edema & stomach swelling.        History of Present Illness: Chelsea Davis is a 82 y.o. female who presents for  a followup visit regarding chronic diastolic heart failure and refractory hypertension. She has chronic left bundle branch block, chronic kidney disease and insulin-requiring diabetes. Previous nuclear stress test showed no evidence of ischemia. Renal artery duplex in 2014 showed no evidence of renal artery stenosis.  She was hospitalized at [facility] in 10/2013 for acute on chronic diastolic heart failure.  Carvedilol was stopped due to bradycardia. Echo showed an EF of 65-70% with severe LVH and grade 1 diastolic dysfunction. LE venous duplex showed no DVT. Liver ultrasound showed no evidence of cirrhosis or abnormal portal veins.  She has chronic kidney disease which has been stable with most recent creatinine of 1.5 in December. Most recent echocardiogram in October 2018 showed hyperdynamic LV systolic function with grade 1 diastolic dysfunction, mild mitral regurgitation and mildly dilated left atrium.  She has been doing extremely well with no chest pain, shortness palpitations.  No significant leg edema.  Takes furosemide daily in addition to her other antihypertensive medications.   Past Medical History:  Diagnosis Date  . Benign tumor of labia majora    a. resected/[facility].  . Chronic diastolic CHF (congestive heart failure) ([place])    a. 03/2013 Neg MV;  b. 10/2013 Echo: EF 65-70%, LVH, diast dysfxn, Triv MR/TR, mildly dil LA.  . CKD (chronic kidney disease), stage III ([place])    a. 06/2013 Renal Duplex: nl renal arteries.  . Diabetes mellitus without complication ([place])   .  Essential hypertension   . Hyperlipidemia     Past Surgical History:  Procedure Laterality Date  . hysterectomy    . VULVA SURGERY       Current Outpatient Medications  Medication Sig Dispense Refill  . Albuterol Sulfate (PROAIR HFA IN) Inhale 2 puffs into the lungs as needed (as needed for wheezing or shortness of breath).     Marland Kitchen amLODipine (NORVASC) 5 MG tablet Take 1 tablet (5 mg total) by mouth daily. 90 tablet 3  . aspirin 81 MG tablet Take 81 mg by mouth daily.    Marland Kitchen atorvastatin (LIPITOR) 20 MG tablet Take 20 mg by mouth daily.    . furosemide (LASIX) 20 MG tablet Take 1 tablet (20 mg total) by mouth daily. 30 tablet 5  . hydrALAZINE (APRESOLINE) 50 MG tablet Take 100 mg by mouth 3 (three) times daily.    . insulin NPH-regular Human (NOVOLIN 70/30) (70-30) 100 UNIT/ML injection Inject into the skin. Takes 30 units am and 12 units pm daily.    Marland Kitchen lactulose (CHRONULAC) 10 GM/15ML solution Take 10 g by mouth daily as needed for moderate constipation or severe constipation.     Marland Kitchen losartan (COZAAR) 100 MG tablet Take 100 mg by mouth daily.    . potassium chloride 20 MEQ/15ML (10%) SOLN Take 20 mEq by mouth daily.    Marland Kitchen triamterene-hydrochlorothiazide (DYAZIDE) 37.5-25 MG capsule Take 2 capsules by mouth daily.  11   No current facility-administered medications for this visit.  Allergies:   Amlodipine; Coreg [carvedilol]; Dye fdc red [red dye]; Iodides; and Spironolactone    Social History:  The patient  reports that  has never smoked. she has never used smokeless tobacco. She reports that she does not drink alcohol or use drugs.   Family History:  The patient's family history includes Hypertension in her father and mother.    ROS:  Please see the history of present illness.   Otherwise, review of systems are positive for none.   All other systems are reviewed and negative.    PHYSICAL EXAM: VS:  BP (!) 110/50 (BP Location: Left Arm, Patient Position: Sitting, Cuff Size:  Normal)   Pulse 63   Ht 5\' 5"  (1.651 m)   Wt 172 lb 8 oz (78.2 kg)   BMI 28.71 kg/m  , BMI Body mass index is 28.71 kg/m. GEN: Well nourished, well developed, in no acute distress  HEENT: normal  Neck: no JVD, carotid bruits, or masses Cardiac: RRR; no murmurs, rubs, or gallops, trace edema  Respiratory:  clear to auscultation bilaterally, normal work of breathing GI: soft, nontender, nondistended, + BS MS: no deformity or atrophy  Skin: warm and dry, no rash Neuro:  Strength and sensation are intact Psych: euthymic mood, full affect   EKG:  EKG is ordered today. The ekg ordered today demonstrates normal sinus rhythm with first-degree AV block, left bundle branch block.   Recent Labs: No results found for requested labs within last 8760 hours.    Lipid Panel    Component Value Date/Time   CHOL 138 03/25/2013 1320   TRIG 85 03/25/2013 1320   HDL 51 03/25/2013 1320   VLDL 17 03/25/2013 1320   LDLCALC 70 03/25/2013 1320      Wt Readings from Last 3 Encounters:  10/31/17 172 lb 8 oz (78.2 kg)  07/24/17 173 lb 4 oz (78.6 kg)  10/18/16 177 lb 8 oz (80.5 kg)       ASSESSMENT AND PLAN:  1.  Chronic diastolic heart failure:  The patient appears to be euvolemic on small dose furosemide. Her weight has been relatively stable since October.  I reviewed most recent labs in December showed stable creatinine at 1.5.  2. Essential hypertension: Blood pressure is controlled on current medications.  3.  Asymptomatic bradycardia: Continue to monitor closely as she does have underlying first-degree AV block and left bundle branch block.   Disposition:   FU with me in 9 months  Signed,  Kathlyn Sacramento, MD  10/31/2017 1:57 PM    [facility]

## 2017-10-31 NOTE — Patient Instructions (Signed)
Medication Instructions: Continue same medications.   Labwork: None.   Procedures/Testing: None.   Follow-Up: 9 months with Dr. Arida.   Any Additional Special Instructions Will Be Listed Below (If Applicable).     If you need a refill on your cardiac medications before your next appointment, please call your pharmacy.   

## 2018-01-01 ENCOUNTER — Other Ambulatory Visit: Payer: Self-pay

## 2018-01-01 ENCOUNTER — Emergency Department: Payer: Medicare Other

## 2018-01-01 ENCOUNTER — Emergency Department
Admission: EM | Admit: 2018-01-01 | Discharge: 2018-01-01 | Disposition: A | Discharge status: Home / Self Care | Payer: Medicare Other | Attending: Emergency Medicine | Admitting: Emergency Medicine

## 2018-01-01 DIAGNOSIS — S0081XA Abrasion of other part of head, initial encounter: Secondary | ICD-10-CM

## 2018-01-01 DIAGNOSIS — W19XXXA Unspecified fall, initial encounter: Secondary | ICD-10-CM

## 2018-01-01 DIAGNOSIS — S60419A Abrasion of unspecified finger, initial encounter: Secondary | ICD-10-CM | POA: Diagnosis not present

## 2018-01-01 DIAGNOSIS — I5032 Chronic diastolic (congestive) heart failure: Secondary | ICD-10-CM | POA: Diagnosis not present

## 2018-01-01 DIAGNOSIS — I130 Hypertensive heart and chronic kidney disease with heart failure and stage 1 through stage 4 chronic kidney disease, or unspecified chronic kidney disease: Secondary | ICD-10-CM | POA: Diagnosis not present

## 2018-01-01 DIAGNOSIS — Y939 Activity, unspecified: Secondary | ICD-10-CM | POA: Diagnosis not present

## 2018-01-01 DIAGNOSIS — W108XXA Fall (on) (from) other stairs and steps, initial encounter: Secondary | ICD-10-CM | POA: Insufficient documentation

## 2018-01-01 DIAGNOSIS — S0990XA Unspecified injury of head, initial encounter: Secondary | ICD-10-CM | POA: Diagnosis present

## 2018-01-01 DIAGNOSIS — S0181XA Laceration without foreign body of other part of head, initial encounter: Secondary | ICD-10-CM

## 2018-01-01 DIAGNOSIS — E0789 Other specified disorders of thyroid: Secondary | ICD-10-CM

## 2018-01-01 DIAGNOSIS — Y998 Other external cause status: Secondary | ICD-10-CM | POA: Diagnosis not present

## 2018-01-01 DIAGNOSIS — N183 Chronic kidney disease, stage 3 (moderate): Secondary | ICD-10-CM | POA: Insufficient documentation

## 2018-01-01 DIAGNOSIS — Z23 Encounter for immunization: Secondary | ICD-10-CM | POA: Insufficient documentation

## 2018-01-01 DIAGNOSIS — Y929 Unspecified place or not applicable: Secondary | ICD-10-CM | POA: Insufficient documentation

## 2018-01-01 DIAGNOSIS — E1122 Type 2 diabetes mellitus with diabetic chronic kidney disease: Secondary | ICD-10-CM | POA: Insufficient documentation

## 2018-01-01 MED ORDER — LIDOCAINE-EPINEPHRINE (PF) 1 %-1:200000 IJ SOLN
20.0000 mL | Freq: Once | INTRAMUSCULAR | Status: AC
  Administered 2018-01-01: 20 mL via INTRADERMAL
  Filled 2018-01-01: qty 30

## 2018-01-01 MED ORDER — TETANUS-DIPHTH-ACELL PERTUSSIS 5-2.5-18.5 LF-MCG/0.5 IM SUSP
0.5000 mL | Freq: Once | INTRAMUSCULAR | Status: AC
  Administered 2018-01-01: 0.5 mL via INTRAMUSCULAR
  Filled 2018-01-01: qty 0.5

## 2018-01-01 MED ORDER — ACETAMINOPHEN 325 MG PO TABS
650.0000 mg | ORAL_TABLET | Freq: Once | ORAL | Status: AC
  Administered 2018-01-01: 650 mg via ORAL
  Filled 2018-01-01: qty 2

## 2018-01-01 MED ORDER — LIDOCAINE-EPINEPHRINE-TETRACAINE (LET) SOLUTION
3.0000 mL | Freq: Once | NASAL | Status: AC
  Administered 2018-01-01: 3 mL via TOPICAL
  Filled 2018-01-01: qty 3

## 2018-01-01 MED ORDER — BACITRACIN ZINC 500 UNIT/GM EX OINT
TOPICAL_OINTMENT | Freq: Once | CUTANEOUS | Status: AC
  Administered 2018-01-01: 2 via TOPICAL
  Filled 2018-01-01: qty 0.9

## 2018-01-01 MED ORDER — OXYCODONE-ACETAMINOPHEN 5-325 MG PO TABS: 1.0000 | ORAL_TABLET | Freq: Four times a day (QID) | ORAL | 0 refills | Status: DC | PRN

## 2018-01-01 NOTE — Discharge Instructions (Addendum)
You may continue to apply Neosporin, bacitracin or any triple antibiotic cream and a thick coat to the abrasions and stitches on her face and her right hand.  May take Tylenol for mild to moderate pain and Percocet for severe pain.  Do not drive within 8 hours of taking Percocet.  Please be careful as Percocet can make you unsteady on your feet and always request assistance when you are walking.  Do not take Motrin, Advil, ibuprofen or any other medications in the NSAID category of medicines as these can worsen your kidney function.  Today your CT scan showed a thyroid mass that will need workup by her primary care physician; please let your primary care physician know and he or she will order an ultrasound for further evaluation.  Return to the emergency department if you develop severe pain, nausea vomiting or severe headache, difficulty walking, or any other symptoms concerning to you.

## 2018-01-01 NOTE — ED Provider Notes (Signed)
[facility] Emergency Department Provider Note  ____________________________________________  Time seen: Approximately 3:48 PM  I have reviewed the triage vital signs and the nursing notes.   HISTORY  Chief Complaint Fall and Head Injury    HPI Chelsea Davis is a 82 y.o. female with a history of HTN, HL, DM, CHF, presenting with a fall and resulting head injury.  The patient reports that she was going down the steps prior to arrival when her cat got tangled up in her feet and she "rolled all the way down the stairs."  She did injure her forehead and scalp but denies losing consciousness.  She has no neck or back pain, extremity pain.  She did not have any associated chest pain, shortness of breath, lightheadedness or syncope.  She is not anticoagulated, other than a daily asa.  Past Medical History:  Diagnosis Date  . Benign tumor of labia majora    a. resected/[facility].  . Chronic diastolic CHF (congestive heart failure) ([place])    a. 03/2013 Neg MV;  b. 10/2013 Echo: EF 65-70%, LVH, diast dysfxn, Triv MR/TR, mildly dil LA.  . CKD (chronic kidney disease), stage III ([place])    a. 06/2013 Renal Duplex: nl renal arteries.  . Diabetes mellitus without complication ([place])   . Essential hypertension   . Hyperlipidemia     Patient Active Problem List   Diagnosis Date Noted  . CKD (chronic kidney disease), stage III ([place])   . Hyperlipidemia   . Essential hypertension   . Chronic diastolic CHF (congestive heart failure) ([place])   . Diabetes mellitus without complication ([place])   . Diabetes mellitus ([place]) 03/07/2014  . Constipation 12/16/2013  . Chronic diastolic heart failure ([place])   . Hypertension     Past Surgical History:  Procedure Laterality Date  . hysterectomy    . VULVA SURGERY      Current Outpatient Rx  . Order #: [id number] Class: Historical Med  . Order #: [id number] Class: Normal  . Order #: [id number] Class: Historical Med  . Order #: [id number] Class:  Historical Med  . Order #: [id number] Class: Normal  . Order #: [id number] Class: Historical Med  . Order #: [id number] Class: Historical Med  . Order #: [id number] Class: Historical Med  . Order #: [id number] Class: Historical Med  . Order #: [id number] Class: Print  . Order #: [id number] Class: Historical Med  . Order #: [id number] Class: Historical Med    Allergies Amlodipine; Coreg [carvedilol]; Dye fdc red [red dye]; Iodides; and Spironolactone  Family History  Problem Relation Age of Onset  . Hypertension Mother   . Hypertension Father     Social History Social History   Tobacco Use  . Smoking status: Never Smoker  . Smokeless tobacco: Never Used  Substance Use Topics  . Alcohol use: No  . Drug use: No    Review of Systems Constitutional: No fever/chills.  Lightheadedness or syncope.  No loss of consciousness.  Positive mechanical fall. Eyes: No visual changes.  No blurred or double vision.   ENT: No congestion or rhinorrhea.  No dental injury. Cardiovascular: Denies chest pain. Denies palpitations. Respiratory: Denies shortness of breath.  No cough. Gastrointestinal: No abdominal pain.  No nausea, no vomiting.  No diarrhea.  No constipation. Genitourinary: Negative for dysuria. Musculoskeletal: Negative for back pain.  No neck pain.  No pain in the extremities. Skin: Negative for rash.  Positive laceration to forehead and multiple abrasions on the face and right hand. Neurological: Negative for headaches. No focal numbness, tingling or weakness.  ____________________________________________   PHYSICAL EXAM:  VITAL SIGNS: ED Triage Vitals  Enc Vitals Group     BP 01/01/18 1351 (!) 186/46     Pulse Rate 01/01/18 1351 90     Resp 01/01/18 1351 18     Temp 01/01/18 1351 98.9 F (37.2 C)     Temp Source 01/01/18 1351 Oral     SpO2 01/01/18 1351 99 %     Weight 01/01/18 1352 170 lb (77.1 kg)     Height 01/01/18 1352 5\' 1"  (1.549 m)     Head Circumference --      Peak  Flow --      Pain Score 01/01/18 1351 0     Pain Loc --      Pain Edu? --      Excl. in [place]? --     Constitutional: Alert and oriented. Answers questions appropriately.  Patient does have some obvious injuries but is lying comfortably in the stretcher. Eyes: Conjunctivae are normal.  EOMI. No scleral icterus. Head: The patient has a 4 cm laceration on the right forehead and a 3 x 2" area of superficial abrasion over the right scalp.  She also has a linear abrasion that is approximate 1 x 2 inches.  Over the right zygomatic arch with some bruising below the right orbit.  No battle sign. EARS: TMs are clear without hemotympanum. Nose: No congestion/rhinnorhea.  No swelling over the nose.  No septal hematoma. Mouth/Throat: Mucous membranes are moist.  No dental injury or malocclusion. Neck: No stridor.  Supple.  The patient is collared but on my examination has no midline C-spine tenderness to palpation, step-offs or deformities.  She has negative imaging and I have tested her range of motion which is full and without pain so the collar has been clinically removed. Cardiovascular: Normal rate, regular rhythm. No murmurs, rubs or gallops.  Respiratory: Normal respiratory effort.  No accessory muscle use or retractions. Lungs CTAB.  No wheezes, rales or ronchi. Gastrointestinal: Overweight.  Soft, nontender and nondistended.  No guarding or rebound.  No peritoneal signs. Musculoskeletal: Pelvis is stable.  The patient has full range of motion of the bilateral ankles, knees, hips, wrists, elbows and shoulders without pain.  The patient has normal DP and PT pulses, normal radial pulses. Neurologic:  A&Ox3.  Speech is clear.  Face and smile are symmetric.  EOMI.  Moves all extremities well. Skin:  Skin is warm, dry.  See laceration description above.  The patient also has a thickened rash on the medial left lower leg just above the ankle that she states is old. Psychiatric: Mood and affect are normal.  Speech and behavior are normal.  Normal judgement.  ____________________________________________   LABS (all labs ordered are listed, but only abnormal results are displayed)  Labs Reviewed - No data to display ____________________________________________  EKG  ED ECG REPORT I, Eula Listen, the attending physician, personally viewed and interpreted this ECG.   Date: 01/01/2018  EKG Time: 1558  Rate: 67  Rhythm: normal sinus rhythm; LBBB  Axis: leftward  Intervals:first-degree A-V block   ST&T Change: No STEMI; neg for [place] criteria.  EKG is compared to prior EKG of 10/31/17 at:, She has an old left bundle branch block and the morphology of her EKG is unchanged  ____________________________________________  RADIOLOGY  Ct Head Wo Contrast  Result Date: 01/01/2018 CLINICAL DATA:  Pain following fall EXAM: CT HEAD WITHOUT CONTRAST CT CERVICAL SPINE WITHOUT CONTRAST TECHNIQUE: Multidetector CT imaging of  the head and cervical spine was performed following the standard protocol without intravenous contrast. Multiplanar CT image reconstructions of the cervical spine were also generated. COMPARISON:  None. FINDINGS: CT HEAD FINDINGS Brain: There is mild diffuse atrophy with slightly greater frontal atrophy bilaterally. There is no appreciable intracranial mass, hemorrhage, extra-axial fluid collection, or midline shift. There is slight small vessel disease in the centra semiovale bilaterally. Elsewhere gray-white compartments are normal. No evident acute infarct. Basal ganglia calcification is felt to be physiologic. Vascular: No hyperdense vessel. There is calcification in each carotid siphon region. Skull: The bony calvarium appears intact. Sinuses/Orbits: There is mucosal thickening in several ethmoid air cells. Other visualized paranasal sinuses are clear. Visualized orbits appear symmetric bilaterally. Other: Mastoid air cells are clear. CT CERVICAL SPINE FINDINGS Alignment:  There is no appreciable spondylolisthesis. Skull base and vertebrae: Skull base and craniocervical junction regions appear normal. No evident fracture. No blastic or lytic bone lesions. Soft tissues and spinal canal: Prevertebral soft tissues and predental space regions are normal. There are no paraspinous lesions. There is no evident cord or canal hematoma. Disc levels: There is marked disc space narrowing at C3-4, C4-5, C5-6 and C6-7. There is moderate disc space narrowing at C2-3. There are prominent anterior osteophytes at C3, C4, C5, and C6. There is multilevel facet osteoarthritic change bilaterally. There is moderately severe exit foraminal narrowing on the right at C3-4 with impression on the exiting nerve root due to bony hypertrophy. Similar changes to a slightly lesser extent are noted on the right at C4-5 and on the right at C5-6. There is no frank disc extrusion or high-grade stenosis. Upper chest: Visualized upper lung zones are clear. Other: There is incomplete visualization of an apparent dominant mass arising from the left lobe of the thyroid measuring 3.5 x 3.4 cm. There is calcification in the right carotid artery. IMPRESSION: CT head: Atrophy with mild periventricular small vessel disease. No acute infarct. No mass or hemorrhage. There are foci of arterial vascular calcification. There is mucosal thickening in several ethmoid air cells. CT cervical spine: 1.  No fracture or spondylolisthesis. 2. Multilevel arthropathy with multiple areas of disc space narrowing. Exit foraminal narrowing noted at multiple levels, most severe at C3-4 on the right but also moderately severe at C4-5 and C5-6 on the right as well. No disc extrusion evident. 3. **An incidental finding of potential clinical significance has been found. Dominant mass arising from left lobe of thyroid, incompletely visualized measuring approximately 3.5 x 3.4 cm. Recommend further evaluation with nonemergent thyroid ultrasound. If patient  is clinically hyperthyroid, consider nuclear medicine thyroid uptake and scan.** 4.  Right carotid artery calcification. Electronically Signed   By: Lowella Grip III M.D.   On: 01/01/2018 14:29   Ct Cervical Spine Wo Contrast  Result Date: 01/01/2018 CLINICAL DATA:  Pain following fall EXAM: CT HEAD WITHOUT CONTRAST CT CERVICAL SPINE WITHOUT CONTRAST TECHNIQUE: Multidetector CT imaging of the head and cervical spine was performed following the standard protocol without intravenous contrast. Multiplanar CT image reconstructions of the cervical spine were also generated. COMPARISON:  None. FINDINGS: CT HEAD FINDINGS Brain: There is mild diffuse atrophy with slightly greater frontal atrophy bilaterally. There is no appreciable intracranial mass, hemorrhage, extra-axial fluid collection, or midline shift. There is slight small vessel disease in the centra semiovale bilaterally. Elsewhere gray-white compartments are normal. No evident acute infarct. Basal ganglia calcification is felt to be physiologic. Vascular: No hyperdense vessel. There is calcification in each carotid siphon region.  Skull: The bony calvarium appears intact. Sinuses/Orbits: There is mucosal thickening in several ethmoid air cells. Other visualized paranasal sinuses are clear. Visualized orbits appear symmetric bilaterally. Other: Mastoid air cells are clear. CT CERVICAL SPINE FINDINGS Alignment: There is no appreciable spondylolisthesis. Skull base and vertebrae: Skull base and craniocervical junction regions appear normal. No evident fracture. No blastic or lytic bone lesions. Soft tissues and spinal canal: Prevertebral soft tissues and predental space regions are normal. There are no paraspinous lesions. There is no evident cord or canal hematoma. Disc levels: There is marked disc space narrowing at C3-4, C4-5, C5-6 and C6-7. There is moderate disc space narrowing at C2-3. There are prominent anterior osteophytes at C3, C4, C5, and C6.  There is multilevel facet osteoarthritic change bilaterally. There is moderately severe exit foraminal narrowing on the right at C3-4 with impression on the exiting nerve root due to bony hypertrophy. Similar changes to a slightly lesser extent are noted on the right at C4-5 and on the right at C5-6. There is no frank disc extrusion or high-grade stenosis. Upper chest: Visualized upper lung zones are clear. Other: There is incomplete visualization of an apparent dominant mass arising from the left lobe of the thyroid measuring 3.5 x 3.4 cm. There is calcification in the right carotid artery. IMPRESSION: CT head: Atrophy with mild periventricular small vessel disease. No acute infarct. No mass or hemorrhage. There are foci of arterial vascular calcification. There is mucosal thickening in several ethmoid air cells. CT cervical spine: 1.  No fracture or spondylolisthesis. 2. Multilevel arthropathy with multiple areas of disc space narrowing. Exit foraminal narrowing noted at multiple levels, most severe at C3-4 on the right but also moderately severe at C4-5 and C5-6 on the right as well. No disc extrusion evident. 3. **An incidental finding of potential clinical significance has been found. Dominant mass arising from left lobe of thyroid, incompletely visualized measuring approximately 3.5 x 3.4 cm. Recommend further evaluation with nonemergent thyroid ultrasound. If patient is clinically hyperthyroid, consider nuclear medicine thyroid uptake and scan.** 4.  Right carotid artery calcification. Electronically Signed   By: Lowella Grip III M.D.   On: 01/01/2018 14:29    ____________________________________________   PROCEDURES  Procedure(s) performed: None  .Marland KitchenLaceration Repair Date/Time: 01/01/2018 5:00 PM Performed by: Eula Listen, MD Authorized by: Eula Listen, MD   Consent:    Consent obtained:  Verbal   Consent given by:  Patient   Risks discussed:  Infection, pain,  retained foreign body, poor cosmetic result and poor wound healing   Alternatives discussed:  No treatment Anesthesia (see MAR for exact dosages):    Anesthesia method:  Local infiltration   Local anesthetic:  Lidocaine 1% w/o epi and lidocaine 1% WITH epi Laceration details:    Location:  Face   Length (cm):  4 Repair type:    Repair type:  Simple Pre-procedure details:    Preparation:  Patient was prepped and draped in usual sterile fashion Exploration:    Hemostasis achieved with:  Direct pressure   Wound exploration: entire depth of wound probed and visualized     Contaminated: no   Treatment:    Area cleansed with:  Saline   Amount of cleaning:  Extensive   Irrigation solution:  Sterile saline   Visualized foreign bodies/material removed: no   Skin repair:    Repair method:  Sutures   Suture size:  6-0   Suture material:  Prolene Approximation:    Approximation:  Close Post-procedure  details:    Dressing:  Sterile dressing and antibiotic ointment   Patient tolerance of procedure:  Tolerated well, no immediate complications    Critical Care performed: No ____________________________________________   INITIAL IMPRESSION / ASSESSMENT AND PLAN / ED COURSE  Pertinent labs & imaging results that were available during my care of the patient were reviewed by me and considered in my medical decision making (see chart for details).  82 y.o. female status post mechanical fall with head injury, without loss of consciousness, headache, nausea or vomiting or any neurologic deficits.  Overall, the patient is hemodynamically stable; she is mildly hypertensive at 186/40 6-year.  She is undergone CT imaging which shows no acute intracranial process and no new C-spine abnormalities.  She does have an incidental finding of a thyroid nodule, which I have told her about she will follow-up with her primary care physician for.  The patient gives no history that would be suggestive of a  syncopal episode today and will get a screening EKG.  The patient will have her abrasions washed and treated with a triple antibiotic cream, and her laceration repaired.  Anticipate discharge with close PMD follow-up  ____________________________________________  FINAL CLINICAL IMPRESSION(S) / ED DIAGNOSES  Final diagnoses:  Thyroid mass of unclear etiology  Laceration of forehead, initial encounter  Abrasion, face w/o infection  Fall, initial encounter  Abrasion of finger of right hand, initial encounter         NEW MEDICATIONS STARTED DURING THIS VISIT:  New Prescriptions   OXYCODONE-ACETAMINOPHEN (PERCOCET) 5-325 MG TABLET    Take 1 tablet by mouth every 6 (six) hours as needed for severe pain.      Eula Listen, MD 01/01/18 [phone]

## 2018-01-01 NOTE — ED Triage Notes (Signed)
Pt states she lost her balance and fell forward down brick steps , pt is unsure if she lost consciousness. Pt has noted abrasions to the right side of face/head with swelling. Pt c/o neck pain, c-collar applied in triage.. Denies any pain at present other then abrasions to right hand.Chelsea Davis

## 2018-01-13 DIAGNOSIS — S0181XA Laceration without foreign body of other part of head, initial encounter: Secondary | ICD-10-CM | POA: Insufficient documentation

## 2018-09-18 ENCOUNTER — Encounter: Payer: Self-pay | Admitting: Cardiovascular Disease

## 2018-09-18 ENCOUNTER — Ambulatory Visit (INDEPENDENT_AMBULATORY_CARE_PROVIDER_SITE_OTHER): Payer: Medicare Other | Admitting: Cardiovascular Disease

## 2018-09-18 VITALS — BP 132/48 | HR 61 | Ht 65.0 in | Wt 175.2 lb

## 2018-09-18 DIAGNOSIS — I5032 Chronic diastolic (congestive) heart failure: Secondary | ICD-10-CM

## 2018-09-18 DIAGNOSIS — I10 Essential (primary) hypertension: Secondary | ICD-10-CM | POA: Diagnosis not present

## 2018-09-18 DIAGNOSIS — R001 Bradycardia, unspecified: Secondary | ICD-10-CM | POA: Diagnosis not present

## 2018-09-18 NOTE — Patient Instructions (Signed)
Medication Instructions:  No changes at this time.  If you need a refill on your cardiac medications before your next appointment, please call your pharmacy.   Lab work: None today If you have labs (blood work) drawn today and your tests are completely normal, you will receive your results only by: Marland Kitchen MyChart Message (if you have MyChart) OR . A paper copy in the mail If you have any lab test that is abnormal or we need to change your treatment, we will call you to review the results.  Testing/Procedures: None at this time.   Follow-Up: At [facility], you and your health needs are our priority.  As part of our continuing mission to provide you with exceptional heart care, we have created designated Provider Care Teams.  These Care Teams include your primary Cardiologist (physician) and Advanced Practice Providers (APPs -  Physician Assistants and Nurse Practitioners) who all work together to provide you with the care you need, when you need it. You will need a follow up appointment in 1 years.  Please call our office 2 months in advance to schedule this appointment.  You may see Dr. Kathlyn Sacramento or one of the following Advanced Practice Providers on your designated Care Team:   Murray Hodgkins, NP Christell Faith, PA-C . Marrianne Mood, PA-C  Any Other Special Instructions Will Be Listed Below (If Applicable).

## 2018-09-18 NOTE — Progress Notes (Signed)
Cardiology Office Note   Date:  09/18/2018   ID:  Chelsea Davis, DOB [birth year], MRN [id number]  PCP:  Rogelia Rohrer, MD  Cardiologist:   Kathlyn Sacramento, MD   Chief Complaint  Patient presents with  . OTHER    9 month f/u c/o edema ankles/abd. Meds reviewed verbally with pt.      History of Present Illness: Chelsea Davis is a 82 y.o. female who presents for  a followup visit regarding chronic diastolic heart failure and refractory hypertension. She has chronic left bundle branch block, chronic kidney disease and insulin-requiring diabetes. Previous nuclear stress test showed no evidence of ischemia. Renal artery duplex in 2014 showed no evidence of renal artery stenosis.  She was hospitalized at [facility] in 10/2013 for acute on chronic diastolic heart failure.  Carvedilol was stopped due to bradycardia. Echo showed an EF of 65-70% with severe LVH and grade 1 diastolic dysfunction. LE venous duplex showed no DVT. Liver ultrasound showed no evidence of cirrhosis or abnormal portal veins.  Most recent echocardiogram in October 2018 showed hyperdynamic LV systolic function with grade 1 diastolic dysfunction, mild mitral regurgitation and mildly dilated left atrium.  She has been doing well with no recent chest pain or shortness of breath.  She takes furosemide 20 mg daily.  Her leg edema is controlled.  Past Medical History:  Diagnosis Date  . Benign tumor of labia majora    a. resected/[facility].  . Chronic diastolic CHF (congestive heart failure) ([place])    a. 03/2013 Neg MV;  b. 10/2013 Echo: EF 65-70%, LVH, diast dysfxn, Triv MR/TR, mildly dil LA.  . CKD (chronic kidney disease), stage III ([place])    a. 06/2013 Renal Duplex: nl renal arteries.  . Diabetes mellitus without complication ([place])   . Essential hypertension   . Hyperlipidemia     Past Surgical History:  Procedure Laterality Date  . hysterectomy    . VULVA SURGERY       Current Outpatient Medications  Medication Sig Dispense  Refill  . Albuterol Sulfate (PROAIR HFA IN) Inhale 2 puffs into the lungs as needed (as needed for wheezing or shortness of breath).     Marland Kitchen amLODipine (NORVASC) 5 MG tablet Take 1 tablet (5 mg total) by mouth daily. 90 tablet 3  . aspirin 81 MG tablet Take 81 mg by mouth daily.    Marland Kitchen atorvastatin (LIPITOR) 20 MG tablet Take 20 mg by mouth daily.    . furosemide (LASIX) 20 MG tablet Take 1 tablet (20 mg total) by mouth daily. (Patient taking differently: Take 20 mg by mouth daily as needed. ) 30 tablet 5  . hydrALAZINE (APRESOLINE) 50 MG tablet Take 100 mg by mouth 3 (three) times daily.    . insulin NPH-regular Human (NOVOLIN 70/30) (70-30) 100 UNIT/ML injection Inject into the skin. Takes 50 units am and 14 units pm daily.    Marland Kitchen lactulose (CHRONULAC) 10 GM/15ML solution Take 10 g by mouth daily as needed for moderate constipation or severe constipation.     Marland Kitchen losartan (COZAAR) 100 MG tablet Take 100 mg by mouth daily.    Marland Kitchen oxyCODONE-acetaminophen (PERCOCET) 5-325 MG tablet Take 1 tablet by mouth every 6 (six) hours as needed for severe pain. 6 tablet 0  . potassium chloride 20 MEQ/15ML (10%) SOLN Take 20 mEq by mouth daily.    Marland Kitchen triamterene-hydrochlorothiazide (DYAZIDE) 37.5-25 MG capsule Take 2 capsules by mouth daily.  11   No current facility-administered medications for  this visit.     Allergies:   Amlodipine; Coreg [carvedilol]; Dye fdc red [red dye]; Iodides; and Spironolactone    Social History:  The patient  reports that she has never smoked. She has never used smokeless tobacco. She reports that she does not drink alcohol or use drugs.   Family History:  The patient's family history includes Hypertension in her father and mother.    ROS:  Please see the history of present illness.   Otherwise, review of systems are positive for none.   All other systems are reviewed and negative.    PHYSICAL EXAM: VS:  BP (!) 132/48 (BP Location: Left Arm, Patient Position: Sitting, Cuff Size:  Normal)   Pulse 61   Ht 5\' 5"  (1.651 m)   Wt 175 lb 4 oz (79.5 kg)   BMI 29.16 kg/m  , BMI Body mass index is 29.16 kg/m. GEN: Well nourished, well developed, in no acute distress  HEENT: normal  Neck: no JVD, carotid bruits, or masses Cardiac: RRR; no murmurs, rubs, or gallops, trace edema  Respiratory:  clear to auscultation bilaterally, normal work of breathing GI: soft, nontender, nondistended, + BS MS: no deformity or atrophy  Skin: warm and dry, no rash Neuro:  Strength and sensation are intact Psych: euthymic mood, full affect   EKG:  EKG is ordered today. The ekg ordered today demonstrates normal sinus rhythm with first-degree AV block, LVH with QRS widening and repolarization abnormality   Recent Labs: No results found for requested labs within last 8760 hours.    Lipid Panel    Component Value Date/Time   CHOL 138 03/25/2013 1320   TRIG 85 03/25/2013 1320   HDL 51 03/25/2013 1320   VLDL 17 03/25/2013 1320   LDLCALC 70 03/25/2013 1320      Wt Readings from Last 3 Encounters:  09/18/18 175 lb 4 oz (79.5 kg)  01/01/18 170 lb (77.1 kg)  10/31/17 172 lb 8 oz (78.2 kg)       ASSESSMENT AND PLAN:  1.  Chronic diastolic heart failure:  The patient appears to be euvolemic on small dose furosemide.  Most recent creatinine was stable around 1.6.  2. Essential hypertension: Blood pressure is controlled on current medications.  3.  Asymptomatic bradycardia: Continue to monitor closely as she does have underlying first-degree AV block and QRS widening.  Disposition:   FU with me in 12 months  Signed,  Kathlyn Sacramento, MD  09/18/2018 2:32 PM    [place]

## 2018-12-14 DIAGNOSIS — L723 Sebaceous cyst: Secondary | ICD-10-CM | POA: Insufficient documentation

## 2018-12-24 ENCOUNTER — Ambulatory Visit: Payer: Medicare Other | Admitting: General Surgery

## 2018-12-24 ENCOUNTER — Encounter: Payer: Self-pay | Admitting: General Surgery

## 2018-12-24 ENCOUNTER — Other Ambulatory Visit: Payer: Self-pay

## 2018-12-24 VITALS — BP 131/63 | HR 71 | Temp 97.7°F | Resp 16 | Ht 60.0 in | Wt 169.0 lb

## 2018-12-24 DIAGNOSIS — L723 Sebaceous cyst: Secondary | ICD-10-CM

## 2018-12-24 NOTE — Progress Notes (Deleted)
Error

## 2018-12-24 NOTE — Progress Notes (Signed)
Patient ID: Chelsea Davis, female   DOB: Feb 18, 1936, 83 y.o.   MRN: [id number]  Chief Complaint  Patient presents with  . New Patient (Initial Visit)    Cyst on back    HPI Chelsea Davis is a 83 y.o. female   Here today for cyst on back. She has had drainage from it recently. Sore to touch. It has been present for multiple years, comes and goes. HPI  Past Medical History:  Diagnosis Date  . Benign tumor of labia majora    a. resected/[facility].  . Chronic diastolic CHF (congestive heart failure) ([place])    a. 03/2013 Neg MV;  b. 10/2013 Echo: EF 65-70%, LVH, diast dysfxn, Triv MR/TR, mildly dil LA.  . CKD (chronic kidney disease), stage III ([place])    a. 06/2013 Renal Duplex: nl renal arteries.  . Diabetes mellitus without complication ([place])   . Essential hypertension   . Hyperlipidemia     Past Surgical History:  Procedure Laterality Date  . hysterectomy    . VULVA SURGERY      Family History  Problem Relation Age of Onset  . Hypertension Mother   . Hypertension Father     Social History Social History   Tobacco Use  . Smoking status: Never Smoker  . Smokeless tobacco: Never Used  Substance Use Topics  . Alcohol use: No  . Drug use: No    Allergies  Allergen Reactions  . Iodinated Diagnostic Agents Hives, Rash and Swelling  . Amlodipine     Other reaction(s): Unknown edema  . Coreg [Carvedilol]     Bradycardia   . Dye Fdc Red [Red Dye]   . Iodides Hives, Rash and Swelling  . Metformin Diarrhea  . Spironolactone Rash    Current Outpatient Medications  Medication Sig Dispense Refill  . Albuterol Sulfate (PROAIR HFA IN) Inhale 2 puffs into the lungs as needed (as needed for wheezing or shortness of breath).     Marland Kitchen atorvastatin (LIPITOR) 20 MG tablet Take 20 mg by mouth daily.    . furosemide (LASIX) 20 MG tablet Take 1 tablet (20 mg total) by mouth daily. (Patient taking differently: Take 20 mg by mouth daily as needed. ) 30 tablet 5  . hydrALAZINE (APRESOLINE) 50  MG tablet Take 100 mg by mouth 3 (three) times daily.    . insulin NPH-regular Human (NOVOLIN 70/30) (70-30) 100 UNIT/ML injection Inject into the skin. Takes 50 units am and 14 units pm daily.    Marland Kitchen lactulose (CHRONULAC) 10 GM/15ML solution Take 10 g by mouth daily as needed for moderate constipation or severe constipation.     Marland Kitchen losartan (COZAAR) 100 MG tablet Take 100 mg by mouth daily.    . potassium chloride 20 MEQ/15ML (10%) SOLN Take 20 mEq by mouth daily.    Marland Kitchen triamterene-hydrochlorothiazide (DYAZIDE) 37.5-25 MG capsule Take 2 capsules by mouth daily.  11  . amLODipine (NORVASC) 5 MG tablet Take 1 tablet (5 mg total) by mouth daily. 90 tablet 3   No current facility-administered medications for this visit.     Review of Systems Review of Systems  Constitutional: Negative.   Respiratory: Negative.   Cardiovascular: Negative.     Blood pressure 131/63, pulse 71, temperature 97.7 F (36.5 C), temperature source Temporal, resp. rate 16, height 5' (1.524 m), weight 169 lb (76.7 kg), SpO2 94 %.  Physical Exam Physical Exam Constitutional:      Appearance: Normal appearance.  Neck:     Musculoskeletal:  Normal range of motion and neck supple.  Cardiovascular:     Rate and Rhythm: Normal rate and regular rhythm.  Pulmonary:     Effort: Pulmonary effort is normal.     Breath sounds: Normal breath sounds.    Lymphadenopathy:     Cervical: No cervical adenopathy.  Neurological:     Mental Status: She is alert.     Data Reviewed PCP notes of December 14, 2018.  Assessment Large sebaceous cyst of the left back with suggestion of early infection.  Plan The patient and the daughter were amenable to proceed with incision and drainage.  The area was cleansed with ChloraPrep and 30 cc of 0.5% Xylocaine with 0.25% Marcaine with 1 to 200,000 units of epinephrine was utilized and well-tolerated.  The area was recleansed with ChloraPrep and draped.  A 3 cm elliptical incision was used to  remove the central pore and drained approximately 120 cc of liquefied sebaceous cyst content.  The area was irrigated with peroxide and swabbed with gauze.  Finger dissection to break loculations.  Skin edge bleeding was controlled with thermal cautery.  Patient had some mild discomfort with the initial incision but this resolved with 1 cc of 1% plain Xylocaine.  Dry dressing was applied.   She reported transiently feeling poorly 10 minutes after the local anesthetic had been injected but prior to the incision.  Pulse oximetry and pulse were normal.  By the end of the procedure she was asymptomatic.  The fluid removed did not suggest an active infection, and for that reason antibiotics were not prescribed.  The area below the 2 cm area of focal erythema was included in the drainage and should resolve spontaneously.  The patient's daughter, Silva Bandy, RN, was instructed in regards to wound care.  Anticipate she will do well.  While the cyst lining was not removed I think this will certainly heal well and a repeat visit will not be required unless her daughter is concerned.  She may shower and again keep the area covered until it is completely healed.       Forest Gleason  12/24/2018, 1:23 PM  Per Dr. Bary Castilla, patient may call and cancel one week follow up appointment if doing okay. Daughter is a Marine scientist at the hospital.   Dominga Ferry, [place]

## 2018-12-24 NOTE — Patient Instructions (Signed)
We will see you you back in one week.

## 2018-12-31 ENCOUNTER — Encounter: Payer: Self-pay | Admitting: General Surgery

## 2018-12-31 ENCOUNTER — Ambulatory Visit (INDEPENDENT_AMBULATORY_CARE_PROVIDER_SITE_OTHER): Payer: Medicare Other | Admitting: General Surgery

## 2018-12-31 ENCOUNTER — Other Ambulatory Visit: Payer: Self-pay

## 2018-12-31 VITALS — BP 160/58 | HR 70 | Temp 97.9°F | Resp 16 | Ht 60.0 in | Wt 171.8 lb

## 2018-12-31 DIAGNOSIS — L723 Sebaceous cyst: Secondary | ICD-10-CM

## 2018-12-31 NOTE — Progress Notes (Signed)
Patient ID: Chelsea Davis, female   DOB: Feb 27, 1936, 83 y.o.   MRN: [id number]  Chief Complaint  Patient presents with  . Follow-up    one week f/u s/p i&d sebaceous cyst back done on 12-24-18     HPI Chelsea Davis is a 83 y.o. female.  The patient underwent incision and drainage of a huge sebaceous cyst on the left back on December 24, 2018.  She has had minimal pain.  She is accompanied today by 1 of her daughters. Past Medical History:  Diagnosis Date  . Benign tumor of labia majora    a. resected/[facility].  . Chronic diastolic CHF (congestive heart failure) ([place])    a. 03/2013 Neg MV;  b. 10/2013 Echo: EF 65-70%, LVH, diast dysfxn, Triv MR/TR, mildly dil LA.  . CKD (chronic kidney disease), stage III ([place])    a. 06/2013 Renal Duplex: nl renal arteries.  . Diabetes mellitus without complication ([place])   . Essential hypertension   . Hyperlipidemia     Past Surgical History:  Procedure Laterality Date  . hysterectomy    . VULVA SURGERY      Family History  Problem Relation Age of Onset  . Hypertension Mother   . Hypertension Father     Social History Social History   Tobacco Use  . Smoking status: Never Smoker  . Smokeless tobacco: Never Used  Substance Use Topics  . Alcohol use: No  . Drug use: No    Allergies  Allergen Reactions  . Iodinated Diagnostic Agents Hives, Rash and Swelling  . Amlodipine     Other reaction(s): Unknown edema  . Coreg [Carvedilol]     Bradycardia   . Dye Fdc Red [Red Dye]   . Iodides Hives, Rash and Swelling  . Metformin Diarrhea  . Spironolactone Rash    Current Outpatient Medications  Medication Sig Dispense Refill  . Albuterol Sulfate (PROAIR HFA IN) Inhale 2 puffs into the lungs as needed (as needed for wheezing or shortness of breath).     Marland Kitchen amLODipine (NORVASC) 5 MG tablet Take 5 mg by mouth daily.    Marland Kitchen atorvastatin (LIPITOR) 20 MG tablet Take 20 mg by mouth daily.    . furosemide (LASIX) 20 MG tablet Take 1 tablet (20 mg total)  by mouth daily. (Patient taking differently: Take 20 mg by mouth daily as needed. ) 30 tablet 5  . hydrALAZINE (APRESOLINE) 50 MG tablet Take 100 mg by mouth 3 (three) times daily.    . insulin NPH-regular Human (NOVOLIN 70/30) (70-30) 100 UNIT/ML injection Inject into the skin. Takes 50 units am and 14 units pm daily.    Marland Kitchen lactulose (CHRONULAC) 10 GM/15ML solution Take 10 g by mouth daily as needed for moderate constipation or severe constipation.     Marland Kitchen losartan (COZAAR) 100 MG tablet Take 100 mg by mouth daily.    . potassium chloride 20 MEQ/15ML (10%) SOLN Take 20 mEq by mouth daily.    Marland Kitchen triamterene-hydrochlorothiazide (DYAZIDE) 37.5-25 MG capsule Take 2 capsules by mouth daily.  11  . amLODipine (NORVASC) 5 MG tablet Take 1 tablet (5 mg total) by mouth daily. 90 tablet 3   No current facility-administered medications for this visit.     Review of Systems Review of Systems  Constitutional: Negative.   Respiratory: Negative.   Cardiovascular: Negative.     Blood pressure (!) 160/58, pulse 70, temperature 97.9 F (36.6 C), temperature source Temporal, resp. rate 16, height 5' (1.524 m), weight  171 lb 12.8 oz (77.9 kg), SpO2 96 %.  Physical Exam Physical Exam Constitutional:      Appearance: She is well-developed.  Eyes:     General: No scleral icterus.    Conjunctiva/sclera: Conjunctivae normal.  Pulmonary:     Effort: Pulmonary effort is normal.     Breath sounds: Normal breath sounds.    Skin:    General: Skin is warm and dry.  Neurological:     Mental Status: She is alert and oriented to person, place, and time.       Assessment Continued resolution noninfected left back sebaceous cyst.  Plan The patient may use her handheld shower to keep the area clean and her daughter may use a Q-tip to gently remove any solid debris.  We will have her return for a evaluation with the nurse in 1 week.  The patient did report she was going to go to Sunday school this coming  week, and she was discouraged from doing so due to the recent [place] flu pandemic.  She reported that there were less than 10 people and she would return promptly home.   HPI, Physical Exam, Assessment and Plan have been scribed under the direction and in the presence of Hervey Ard, Md.  Eudelia Bunch R. Bobette Mo, CMA I have completed the exam and reviewed the above documentation for accuracy and completeness.  I agree with the above.  Haematologist has been used and any errors in dictation or transcription are unintentional.  Hervey Ard, M.D., F.A.C.S.  Forest Gleason Mckynleigh Mussell 12/31/2018, 9:56 AM

## 2018-12-31 NOTE — Patient Instructions (Signed)
Patient will need to return to the office in 1 week as a nurse visit.   Call the office with any questions or concerns.

## 2019-01-05 ENCOUNTER — Ambulatory Visit: Payer: Medicare Other

## 2019-01-05 ENCOUNTER — Other Ambulatory Visit: Payer: Self-pay

## 2019-01-05 DIAGNOSIS — L723 Sebaceous cyst: Secondary | ICD-10-CM

## 2019-01-05 NOTE — Progress Notes (Signed)
Patient ID: Chelsea Davis, female   DOB: [birth year], 83 y.o.   MRN: [id number]   Patient came in today for a wound check. The wound is clean, with no signs of infection noted. Small amount of drainage noted. Follow up in one week with the nurse.

## 2019-01-12 ENCOUNTER — Ambulatory Visit (INDEPENDENT_AMBULATORY_CARE_PROVIDER_SITE_OTHER): Payer: Medicare Other

## 2019-01-12 ENCOUNTER — Other Ambulatory Visit: Payer: Self-pay

## 2019-01-12 VITALS — BP 174/63 | HR 74 | Temp 97.5°F | Resp 18 | Ht 61.0 in | Wt 171.8 lb

## 2019-01-12 DIAGNOSIS — L723 Sebaceous cyst: Secondary | ICD-10-CM | POA: Diagnosis not present

## 2019-01-12 NOTE — Patient Instructions (Signed)
Please continue to wash the wound with soap and water daily and apply a clean dressing.   Please call if you have questions or concerns.

## 2019-01-12 NOTE — Progress Notes (Signed)
Removed dressing from wound. There was minimal drainage, on dressing. No redness, no fever, no chills, no pain. Patient stated she has been washing with soap and water daily and applying clean dressing.

## 2019-01-18 ENCOUNTER — Other Ambulatory Visit: Payer: Self-pay

## 2019-03-18 ENCOUNTER — Ambulatory Visit (INDEPENDENT_AMBULATORY_CARE_PROVIDER_SITE_OTHER): Payer: Medicare Other | Admitting: General Surgery

## 2019-03-18 ENCOUNTER — Encounter: Payer: Self-pay | Admitting: General Surgery

## 2019-03-18 ENCOUNTER — Other Ambulatory Visit: Payer: Self-pay

## 2019-03-18 VITALS — BP 156/64 | HR 65 | Temp 97.7°F | Resp 16 | Ht 61.0 in | Wt 170.0 lb

## 2019-03-18 DIAGNOSIS — L723 Sebaceous cyst: Secondary | ICD-10-CM

## 2019-03-18 NOTE — Progress Notes (Signed)
Patient ID: Chelsea Davis, female   DOB: [birth year], 83 y.o.   MRN: [id number]  Chief Complaint  Patient presents with  . Cyst    on her lower back    HPI Chelsea Davis is a 83 y.o. female.  Here for evaluation of a new cyst on her lower back. It was swollen and noticed some occasional drainage. She is here with her husband of 73 years, [place].  HPI  Past Medical History:  Diagnosis Date  . Benign tumor of labia majora    a. resected/[facility].  . Chronic diastolic CHF (congestive heart failure) ([place])    a. 03/2013 Neg MV;  b. 10/2013 Echo: EF 65-70%, LVH, diast dysfxn, Triv MR/TR, mildly dil LA.  . CKD (chronic kidney disease), stage III ([place])    a. 06/2013 Renal Duplex: nl renal arteries.  . Diabetes mellitus without complication ([place])   . Essential hypertension   . Hyperlipidemia     Past Surgical History:  Procedure Laterality Date  . hysterectomy    . VULVA SURGERY      Family History  Problem Relation Age of Onset  . Hypertension Mother   . Hypertension Father     Social History Social History   Tobacco Use  . Smoking status: Never Smoker  . Smokeless tobacco: Never Used  Substance Use Topics  . Alcohol use: No  . Drug use: No    Allergies  Allergen Reactions  . Iodinated Diagnostic Agents Hives, Rash and Swelling  . Amlodipine     Other reaction(s): Unknown edema  . Coreg [Carvedilol]     Bradycardia   . Dye Fdc Red [Red Dye]   . Iodides Hives, Rash and Swelling  . Metformin Diarrhea  . Spironolactone Rash    Current Outpatient Medications  Medication Sig Dispense Refill  . Albuterol Sulfate (PROAIR HFA IN) Inhale 2 puffs into the lungs as needed (as needed for wheezing or shortness of breath).     Marland Kitchen amLODipine (NORVASC) 5 MG tablet Take 5 mg by mouth daily.    Marland Kitchen atorvastatin (LIPITOR) 20 MG tablet Take 20 mg by mouth daily.    . furosemide (LASIX) 20 MG tablet Take 1 tablet (20 mg total) by mouth daily. (Patient taking differently: Take 20 mg by  mouth daily as needed. ) 30 tablet 5  . hydrALAZINE (APRESOLINE) 50 MG tablet Take 100 mg by mouth 3 (three) times daily.    . insulin NPH-regular Human (NOVOLIN 70/30) (70-30) 100 UNIT/ML injection Inject into the skin. Takes 50 units am and 14 units pm daily.    Marland Kitchen lactulose (CHRONULAC) 10 GM/15ML solution Take 10 g by mouth daily as needed for moderate constipation or severe constipation.     Marland Kitchen losartan (COZAAR) 100 MG tablet Take 100 mg by mouth daily.    . potassium chloride 20 MEQ/15ML (10%) SOLN Take 20 mEq by mouth daily.    Marland Kitchen triamterene-hydrochlorothiazide (DYAZIDE) 37.5-25 MG capsule Take 2 capsules by mouth daily.  11  . amLODipine (NORVASC) 5 MG tablet Take 1 tablet (5 mg total) by mouth daily. 90 tablet 3   No current facility-administered medications for this visit.     Review of Systems Review of Systems  Constitutional: Negative.   Respiratory: Negative.   Cardiovascular: Negative.     Blood pressure (!) 156/64, pulse 65, temperature 97.7 F (36.5 C), temperature source Temporal, resp. rate 16, height 5\' 1"  (1.549 m), weight 170 lb (77.1 kg), SpO2 96 %.  Physical Exam  Physical Exam Constitutional:      Appearance: Normal appearance.  Skin:    General: Skin is warm and dry.     Comments: Right mid back sebaceous cyst, 5 cm diameter with draining sinus  Neurological:     Mental Status: She is alert and oriented to person, place, and time.  Psychiatric:        Behavior: Behavior normal.     Data Reviewed The patient was amenable for excision/debridement.  The area was cleansed with ChloraPrep and a total of 20 cc of 0.5% Xylocaine with 0.25% Marcaine with 1-200,000 notes of epinephrine was utilized and well-tolerated.  After suitable waiting.  The area was recleansed with ChloraPrep and draped.  Through an elliptical incision the sinus as well as the thin overlying skin was excised.  The cavity was then debrided.  Not all of the cyst wall could be extracted.  Typical  appearance of a sebaceous cyst was noted.  Scant bleeding was appreciated.  The area was cleansed with peroxide and a bulky dry dressing applied.  The procedure was well-tolerated.  Assessment Infected sebaceous cyst of the right back.  Plan  C/S sent.  The pain she is daughter, Silva Bandy, is a Equities trader.  I spoke with her post procedure regarding wound care.  Follow-up in 1 week.  HPI, assessment, plan and physical exam has been scribed under the direction and in the presence of Robert Bellow, MD. Karie Fetch, RN  I have completed the exam and reviewed the above documentation for accuracy and completeness.  I agree with the above.  Haematologist has been used and any errors in dictation or transcription are unintentional.  Hervey Ard, M.D., F.A.C.S. Forest Gleason Safi Culotta 03/21/2019, 1:23 PM

## 2019-03-18 NOTE — Patient Instructions (Addendum)
The patient is aware to call back for any questions or new concerns.  Change dressing as needed, leave the drain in place

## 2019-03-23 ENCOUNTER — Ambulatory Visit: Payer: Medicare Other | Admitting: General Surgery

## 2019-03-25 ENCOUNTER — Encounter: Payer: Self-pay | Admitting: General Surgery

## 2019-03-25 ENCOUNTER — Ambulatory Visit (INDEPENDENT_AMBULATORY_CARE_PROVIDER_SITE_OTHER): Payer: Medicare Other | Admitting: General Surgery

## 2019-03-25 ENCOUNTER — Other Ambulatory Visit: Payer: Self-pay

## 2019-03-25 VITALS — BP 158/66 | HR 73 | Temp 97.2°F | Resp 18 | Ht 63.0 in | Wt 170.0 lb

## 2019-03-25 DIAGNOSIS — L723 Sebaceous cyst: Secondary | ICD-10-CM

## 2019-03-25 LAB — ANAEROBIC AND AEROBIC CULTURE

## 2019-03-25 NOTE — Progress Notes (Signed)
Patient ID: Chelsea Davis, female   DOB: May 22, 1936, 83 y.o.   MRN: [id number]  Chief Complaint  Patient presents with  . Follow-up    sebaceous cyst    HPI Chelsea Davis is a 83 y.o. female.  Here today for follow up sebaceous cyst of back. No complaints.  HPI  Past Medical History:  Diagnosis Date  . Benign tumor of labia majora    a. resected/[facility].  . Chronic diastolic CHF (congestive heart failure) ([place])    a. 03/2013 Neg MV;  b. 10/2013 Echo: EF 65-70%, LVH, diast dysfxn, Triv MR/TR, mildly dil LA.  . CKD (chronic kidney disease), stage III ([place])    a. 06/2013 Renal Duplex: nl renal arteries.  . Diabetes mellitus without complication ([place])   . Essential hypertension   . Hyperlipidemia     Past Surgical History:  Procedure Laterality Date  . hysterectomy    . VULVA SURGERY      Family History  Problem Relation Age of Onset  . Hypertension Mother   . Hypertension Father     Social History Social History   Tobacco Use  . Smoking status: Never Smoker  . Smokeless tobacco: Never Used  Substance Use Topics  . Alcohol use: No  . Drug use: No    Allergies  Allergen Reactions  . Iodinated Diagnostic Agents Hives, Rash and Swelling  . Amlodipine     Other reaction(s): Unknown edema  . Coreg [Carvedilol]     Bradycardia   . Dye Fdc Red [Red Dye]   . Iodides Hives, Rash and Swelling  . Metformin Diarrhea  . Spironolactone Rash    Current Outpatient Medications  Medication Sig Dispense Refill  . Albuterol Sulfate (PROAIR HFA IN) Inhale 2 puffs into the lungs as needed (as needed for wheezing or shortness of breath).     Marland Kitchen amLODipine (NORVASC) 5 MG tablet Take 5 mg by mouth daily.    Marland Kitchen atorvastatin (LIPITOR) 20 MG tablet Take 20 mg by mouth daily.    . furosemide (LASIX) 20 MG tablet Take 1 tablet (20 mg total) by mouth daily. (Patient taking differently: Take 20 mg by mouth daily as needed. ) 30 tablet 5  . hydrALAZINE (APRESOLINE) 50 MG tablet Take 100 mg by  mouth 3 (three) times daily.    . insulin NPH-regular Human (NOVOLIN 70/30) (70-30) 100 UNIT/ML injection Inject into the skin. Takes 50 units am and 14 units pm daily.    Marland Kitchen lactulose (CHRONULAC) 10 GM/15ML solution Take 10 g by mouth daily as needed for moderate constipation or severe constipation.     Marland Kitchen losartan (COZAAR) 100 MG tablet Take 100 mg by mouth daily.    . potassium chloride 20 MEQ/15ML (10%) SOLN Take 20 mEq by mouth daily.    Marland Kitchen triamterene-hydrochlorothiazide (DYAZIDE) 37.5-25 MG capsule Take 2 capsules by mouth daily.  11  . amLODipine (NORVASC) 5 MG tablet Take 1 tablet (5 mg total) by mouth daily. 90 tablet 3   No current facility-administered medications for this visit.     Review of Systems Review of Systems  Constitutional: Negative.   Respiratory: Negative.   Cardiovascular: Negative.     Blood pressure (!) 158/66, pulse 73, temperature (!) 97.2 F (36.2 C), temperature source Temporal, resp. rate 18, height 5\' 3"  (1.6 m), weight 170 lb (77.1 kg), SpO2 97 %.  Physical Exam Physical Exam Pulmonary:       Data Reviewed Culture from the March 18, 2019 drainage: Result  1 Escherichia coliAbnormal    Comment: Heavy growth  Antimicrobial Susceptibility Comment   Comment:   ** S = Susceptible; I = Intermediate; R = Resistant **           P = Positive; N = Negative        MICS are expressed in micrograms per mL   Antibiotic         RSLT#1  RSLT#2  RSLT#3  RSLT#4  Amoxicillin/Clavulanic Acid  I  Ampicillin           R  Cefazolin           R  Cefepime            I  Ceftriaxone          R  Cefuroxime           R  Ciprofloxacin         R  Ertapenem           S  Gentamicin           S  Imipenem            S  Levofloxacin          R  Meropenem           S  Piperacillin/Tazobactam    S  Tetracycline           S  Tobramycin           R  Trimethoprim/Sulfa       S    Antibiotics not prescribed and presently not indicated.  Assessment Doing well post debridement and drainage of a longstanding sebaceous cyst of the right back.  Plan The area should continue to improve with daily dressing changes.  Her son is taking off work to help care for her until dressing changes are not required.  Anticipate this should be healed within 7-10 days.  Family is to call if any concerns are perceived.  HPI, Physical Exam, Assessment and Plan have been scribed under the direction and in the presence of Chelsea Bellow, MD. Chelsea Davis, CMA  I have completed the exam and reviewed the above documentation for accuracy and completeness.  I agree with the above.  Haematologist has been used and any errors in dictation or transcription are unintentional.  Chelsea Davis, M.D., F.A.C.S.  Chelsea Davis Chelsea Davis 03/25/2019, 7:32 PM

## 2019-03-25 NOTE — Patient Instructions (Signed)
You may shower as usual. Please change the dressing once daily.

## 2019-10-22 ENCOUNTER — Other Ambulatory Visit: Payer: Self-pay

## 2019-10-22 ENCOUNTER — Encounter: Payer: Self-pay | Admitting: Cardiovascular Disease

## 2019-10-22 ENCOUNTER — Ambulatory Visit (INDEPENDENT_AMBULATORY_CARE_PROVIDER_SITE_OTHER): Payer: Medicare Other | Admitting: Cardiovascular Disease

## 2019-10-22 VITALS — BP 130/60 | HR 64 | Ht 63.0 in | Wt 156.8 lb

## 2019-10-22 DIAGNOSIS — I10 Essential (primary) hypertension: Secondary | ICD-10-CM

## 2019-10-22 DIAGNOSIS — I5032 Chronic diastolic (congestive) heart failure: Secondary | ICD-10-CM

## 2019-10-22 DIAGNOSIS — R001 Bradycardia, unspecified: Secondary | ICD-10-CM | POA: Diagnosis not present

## 2019-10-22 NOTE — Progress Notes (Signed)
Cardiology Office Note   Date:  10/22/2019   ID:  DEZEREE REDDIG, DOB 1936-01-09, MRN [id number]  PCP:  Rogelia Rohrer, MD  Cardiologist:   Kathlyn Sacramento, MD   Chief Complaint  Patient presents with  . office visit    12 month F/U; Meds verbally reviewed with patient.      History of Present Illness: Chelsea Davis is a 84 y.o. female who presents for a followup visit regarding chronic diastolic heart failure and refractory hypertension. She has chronic left bundle branch block, chronic kidney disease and insulin-requiring diabetes. Previous nuclear stress test showed no evidence of ischemia. Renal artery duplex in 2014 showed no evidence of renal artery stenosis.  She was hospitalized at [facility] in 10/2013 for acute on chronic diastolic heart failure.  Carvedilol was stopped due to bradycardia. Echo showed an EF of 65-70% with severe LVH and grade 1 diastolic dysfunction. LE venous duplex showed no DVT. Liver ultrasound showed no evidence of cirrhosis or abnormal portal veins.  Most recent echocardiogram in October 2018 showed hyperdynamic LV systolic function with grade 1 diastolic dysfunction, mild mitral regurgitation and mildly dilated left atrium.  She has been doing well overall with no recent chest pain or shortness of breath.  No dizziness or syncope.  She takes furosemide as needed.  She reports being depressed due to her inability to go outside the house given Covid situation.  She is going to get the vaccine.  Past Medical History:  Diagnosis Date  . Benign tumor of labia majora    a. resected/[facility].  . Chronic diastolic CHF (congestive heart failure) ([place])    a. 03/2013 Neg MV;  b. 10/2013 Echo: EF 65-70%, LVH, diast dysfxn, Triv MR/TR, mildly dil LA.  . CKD (chronic kidney disease), stage III    a. 06/2013 Renal Duplex: nl renal arteries.  . Diabetes mellitus without complication ([place])   . Essential hypertension   . Hyperlipidemia     Past Surgical History:  Procedure  Laterality Date  . CYST EXCISION    . hysterectomy    . VULVA SURGERY       Current Outpatient Medications  Medication Sig Dispense Refill  . Albuterol Sulfate (PROAIR HFA IN) Inhale 2 puffs into the lungs as needed (as needed for wheezing or shortness of breath).     Marland Kitchen amLODipine (NORVASC) 5 MG tablet Take 5 mg by mouth daily.    Marland Kitchen aspirin EC 81 MG tablet Take 81 mg by mouth daily.    Marland Kitchen atorvastatin (LIPITOR) 20 MG tablet Take 20 mg by mouth daily.    . furosemide (LASIX) 20 MG tablet Take 1 tablet (20 mg total) by mouth daily. (Patient taking differently: Take 20 mg by mouth daily as needed. ) 30 tablet 5  . hydrALAZINE (APRESOLINE) 50 MG tablet Take 100 mg by mouth 3 (three) times daily.    . insulin NPH-regular Human (NOVOLIN 70/30) (70-30) 100 UNIT/ML injection Inject into the skin. Takes 50 units am and 14 units pm daily.    Marland Kitchen lactulose (CHRONULAC) 10 GM/15ML solution Take 10 g by mouth daily as needed for moderate constipation or severe constipation.     Marland Kitchen losartan (COZAAR) 100 MG tablet Take 100 mg by mouth daily.    Marland Kitchen amLODipine (NORVASC) 5 MG tablet Take 1 tablet (5 mg total) by mouth daily. 90 tablet 3  . potassium chloride 20 MEQ/15ML (10%) SOLN Take 20 mEq by mouth daily.    Marland Kitchen triamterene-hydrochlorothiazide (DYAZIDE)  37.5-25 MG capsule Take 2 capsules by mouth daily.  11   No current facility-administered medications for this visit.    Allergies:   Iodinated diagnostic agents, Amlodipine, Coreg [carvedilol], Dye fdc red [red dye], Iodides, Metformin, and Spironolactone    Social History:  The patient  reports that she has never smoked. She has never used smokeless tobacco. She reports that she does not drink alcohol or use drugs.   Family History:  The patient's family history includes Hypertension in her father and mother.    ROS:  Please see the history of present illness.   Otherwise, review of systems are positive for none.   All other systems are reviewed and  negative.    PHYSICAL EXAM: VS:  BP 130/60 (BP Location: Right Arm, Patient Position: Sitting, Cuff Size: Normal)   Pulse 64   Ht 5\' 3"  (1.6 m)   Wt 156 lb 12 oz (71.1 kg)   SpO2 98%   BMI 27.77 kg/m  , BMI Body mass index is 27.77 kg/m. GEN: Well nourished, well developed, in no acute distress  HEENT: normal  Neck: no JVD, carotid bruits, or masses Cardiac: RRR; no murmurs, rubs, or gallops, mild edema  Respiratory:  clear to auscultation bilaterally, normal work of breathing GI: soft, nontender, nondistended, + BS MS: no deformity or atrophy  Skin: warm and dry, no rash Neuro:  Strength and sensation are intact Psych: euthymic mood, full affect   EKG:  EKG is ordered today. The ekg ordered today demonstrates normal sinus rhythm with first-degree AV block, LVH with QRS widening and repolarization abnormality.  No significant change from before.  Heart rate is 64 bpm.   Recent Labs: No results found for requested labs within last 8760 hours.    Lipid Panel    Component Value Date/Time   CHOL 138 03/25/2013 1320   TRIG 85 03/25/2013 1320   HDL 51 03/25/2013 1320   VLDL 17 03/25/2013 1320   LDLCALC 70 03/25/2013 1320      Wt Readings from Last 3 Encounters:  10/22/19 156 lb 12 oz (71.1 kg)  03/25/19 170 lb (77.1 kg)  03/18/19 170 lb (77.1 kg)       ASSESSMENT AND PLAN:  1.  Chronic diastolic heart failure: The patient seems to be doing well.  She does have mild bilateral leg edema but her weight is actually less than last year.  She is now using furosemide as needed.  She does have underlying chronic kidney disease but creatinine has been stable around 1.5-1.7.  2. Essential hypertension: Blood pressure is controlled on current medications.  3.  Asymptomatic bradycardia: Continue to monitor closely as she does have underlying first-degree AV block and QRS widening.   Disposition:   FU with me in 12 months  Signed,  Kathlyn Sacramento, MD  10/22/2019 3:43 PM     [place]

## 2019-10-22 NOTE — Patient Instructions (Signed)
Medication Instructions:  Your physician recommends that you continue on your current medications as directed. Please refer to the Current Medication list given to you today.  *If you need a refill on your cardiac medications before your next appointment, please call your pharmacy*  Lab Work: None orederd If you have labs (blood work) drawn today and your tests are completely normal, you will receive your results only by: Marland Kitchen MyChart Message (if you have MyChart) OR . A paper copy in the mail If you have any lab test that is abnormal or we need to change your treatment, we will call you to review the results.  Testing/Procedures: None ordered  Follow-Up: At [facility], you and your health needs are our priority.  As part of our continuing mission to provide you with exceptional heart care, we have created designated Provider Care Teams.  These Care Teams include your primary Cardiologist (physician) and Advanced Practice Providers (APPs -  Physician Assistants and Nurse Practitioners) who all work together to provide you with the care you need, when you need it.  Your next appointment:   12 month(s)  The format for your next appointment:   In Person  Provider:    You may see Dr. Fletcher Anon  or one of the following Advanced Practice Providers on your designated Care Team:    Murray Hodgkins, NP  Christell Faith, PA-C  Marrianne Mood, PA-C   Other Instructions N/A

## 2020-04-06 ENCOUNTER — Other Ambulatory Visit: Payer: Self-pay | Admitting: Family Medicine

## 2020-04-06 DIAGNOSIS — R140 Abdominal distension (gaseous): Secondary | ICD-10-CM

## 2020-04-06 DIAGNOSIS — R630 Anorexia: Secondary | ICD-10-CM

## 2020-04-13 ENCOUNTER — Ambulatory Visit
Admission: RE | Admit: 2020-04-13 | Discharge: 2020-04-13 | Disposition: A | Discharge status: Home / Self Care | Payer: Medicare Other | Source: Ambulatory Visit | Attending: Family Medicine | Admitting: Family Medicine

## 2020-04-13 ENCOUNTER — Other Ambulatory Visit: Payer: Self-pay

## 2020-04-13 DIAGNOSIS — I2510 Atherosclerotic heart disease of native coronary artery without angina pectoris: Secondary | ICD-10-CM | POA: Insufficient documentation

## 2020-04-13 DIAGNOSIS — R140 Abdominal distension (gaseous): Secondary | ICD-10-CM | POA: Diagnosis present

## 2020-04-13 DIAGNOSIS — R630 Anorexia: Secondary | ICD-10-CM | POA: Diagnosis not present

## 2020-04-13 DIAGNOSIS — K869 Disease of pancreas, unspecified: Secondary | ICD-10-CM | POA: Insufficient documentation

## 2020-04-13 DIAGNOSIS — R188 Other ascites: Secondary | ICD-10-CM | POA: Insufficient documentation

## 2020-04-13 DIAGNOSIS — I700 Atherosclerosis of aorta: Secondary | ICD-10-CM | POA: Insufficient documentation

## 2020-04-14 ENCOUNTER — Telehealth: Payer: Self-pay | Admitting: General Practice

## 2020-04-14 ENCOUNTER — Telehealth: Payer: Self-pay | Admitting: Cardiology

## 2020-04-14 NOTE — Telephone Encounter (Signed)
Urgent referral sent to [facility]. Kristal @ [facility] called to see if we received. I left message with Corinna Capra and C Winstead.

## 2020-04-14 NOTE — Telephone Encounter (Signed)
Error

## 2020-04-19 ENCOUNTER — Inpatient Hospital Stay: Payer: Medicare Other | Attending: Oncology | Admitting: Oncology

## 2020-04-19 ENCOUNTER — Other Ambulatory Visit: Payer: Self-pay

## 2020-04-19 ENCOUNTER — Inpatient Hospital Stay: Payer: Medicare Other

## 2020-04-19 ENCOUNTER — Encounter: Payer: Self-pay | Admitting: Oncology

## 2020-04-19 VITALS — BP 116/72 | HR 76 | Temp 95.7°F | Resp 18

## 2020-04-19 DIAGNOSIS — K909 Intestinal malabsorption, unspecified: Secondary | ICD-10-CM | POA: Insufficient documentation

## 2020-04-19 DIAGNOSIS — Z90710 Acquired absence of both cervix and uterus: Secondary | ICD-10-CM | POA: Diagnosis not present

## 2020-04-19 DIAGNOSIS — R935 Abnormal findings on diagnostic imaging of other abdominal regions, including retroperitoneum: Secondary | ICD-10-CM

## 2020-04-19 DIAGNOSIS — Z800 Family history of malignant neoplasm of digestive organs: Secondary | ICD-10-CM | POA: Diagnosis not present

## 2020-04-19 DIAGNOSIS — I5032 Chronic diastolic (congestive) heart failure: Secondary | ICD-10-CM | POA: Diagnosis not present

## 2020-04-19 DIAGNOSIS — Z7189 Other specified counseling: Secondary | ICD-10-CM | POA: Insufficient documentation

## 2020-04-19 DIAGNOSIS — R197 Diarrhea, unspecified: Secondary | ICD-10-CM

## 2020-04-19 DIAGNOSIS — E785 Hyperlipidemia, unspecified: Secondary | ICD-10-CM | POA: Insufficient documentation

## 2020-04-19 DIAGNOSIS — D696 Thrombocytopenia, unspecified: Secondary | ICD-10-CM | POA: Insufficient documentation

## 2020-04-19 DIAGNOSIS — I110 Hypertensive heart disease with heart failure: Secondary | ICD-10-CM | POA: Insufficient documentation

## 2020-04-19 DIAGNOSIS — Z794 Long term (current) use of insulin: Secondary | ICD-10-CM | POA: Diagnosis not present

## 2020-04-19 DIAGNOSIS — K8689 Other specified diseases of pancreas: Secondary | ICD-10-CM

## 2020-04-19 DIAGNOSIS — N183 Chronic kidney disease, stage 3 unspecified: Secondary | ICD-10-CM | POA: Diagnosis not present

## 2020-04-19 DIAGNOSIS — R978 Other abnormal tumor markers: Secondary | ICD-10-CM | POA: Diagnosis not present

## 2020-04-19 DIAGNOSIS — R634 Abnormal weight loss: Secondary | ICD-10-CM | POA: Diagnosis not present

## 2020-04-19 DIAGNOSIS — Z79899 Other long term (current) drug therapy: Secondary | ICD-10-CM | POA: Diagnosis not present

## 2020-04-19 DIAGNOSIS — E119 Type 2 diabetes mellitus without complications: Secondary | ICD-10-CM | POA: Insufficient documentation

## 2020-04-19 DIAGNOSIS — Z8249 Family history of ischemic heart disease and other diseases of the circulatory system: Secondary | ICD-10-CM | POA: Insufficient documentation

## 2020-04-19 LAB — CBC WITH DIFFERENTIAL/PLATELET
Abs Immature Granulocytes: 0.05 10*3/uL (ref 0.00–0.07)
Basophils Absolute: 0 10*3/uL (ref 0.0–0.1)
Basophils Relative: 0 %
Eosinophils Absolute: 0.1 10*3/uL (ref 0.0–0.5)
Eosinophils Relative: 0 %
HCT: 39.5 % (ref 36.0–46.0)
Hemoglobin: 13.2 g/dL (ref 12.0–15.0)
Immature Granulocytes: 0 %
Lymphocytes Relative: 11 %
Lymphs Abs: 1.3 10*3/uL (ref 0.7–4.0)
MCH: 29.8 pg (ref 26.0–34.0)
MCHC: 33.4 g/dL (ref 30.0–36.0)
MCV: 89.2 fL (ref 80.0–100.0)
Monocytes Absolute: 0.5 10*3/uL (ref 0.1–1.0)
Monocytes Relative: 4 %
Neutro Abs: 9.8 10*3/uL — ABNORMAL HIGH (ref 1.7–7.7)
Neutrophils Relative %: 85 %
Platelets: 81 10*3/uL — ABNORMAL LOW (ref 150–400)
RBC: 4.43 MIL/uL (ref 3.87–5.11)
RDW: 22.2 % — ABNORMAL HIGH (ref 11.5–15.5)
WBC: 11.7 10*3/uL — ABNORMAL HIGH (ref 4.0–10.5)
nRBC: 0 % (ref 0.0–0.2)

## 2020-04-19 LAB — COMPREHENSIVE METABOLIC PANEL
ALT: 19 U/L (ref 0–44)
AST: 17 U/L (ref 15–41)
Albumin: 2.8 g/dL — ABNORMAL LOW (ref 3.5–5.0)
Alkaline Phosphatase: 70 U/L (ref 38–126)
Anion gap: 14 (ref 5–15)
BUN: 38 mg/dL — ABNORMAL HIGH (ref 8–23)
CO2: 21 mmol/L — ABNORMAL LOW (ref 22–32)
Calcium: 8.3 mg/dL — ABNORMAL LOW (ref 8.9–10.3)
Chloride: 105 mmol/L (ref 98–111)
Creatinine, Ser: 1.62 mg/dL — ABNORMAL HIGH (ref 0.44–1.00)
GFR calc Af Amer: 34 mL/min — ABNORMAL LOW (ref >60)
GFR calc non Af Amer: 29 mL/min — ABNORMAL LOW (ref >60)
Glucose, Bld: 153 mg/dL — ABNORMAL HIGH (ref 70–99)
Potassium: 3.8 mmol/L (ref 3.5–5.1)
Sodium: 140 mmol/L (ref 135–145)
Total Bilirubin: 1.6 mg/dL — ABNORMAL HIGH (ref 0.3–1.2)
Total Protein: 7.4 g/dL (ref 6.5–8.1)

## 2020-04-19 LAB — PROTIME-INR
INR: 1.3 — ABNORMAL HIGH (ref 0.8–1.2)
Prothrombin Time: 15.4 seconds — ABNORMAL HIGH (ref 11.4–15.2)

## 2020-04-19 LAB — TECHNOLOGIST SMEAR REVIEW
RBC Morphology: NORMAL
Tech Review: DECREASED

## 2020-04-19 LAB — LACTATE DEHYDROGENASE: LDH: 268 U/L — ABNORMAL HIGH (ref 98–192)

## 2020-04-19 LAB — APTT: aPTT: 30 seconds (ref 24–36)

## 2020-04-19 MED ORDER — PANCRELIPASE (LIP-PROT-AMYL) 36000-114000 UNITS PO CPEP: ORAL_CAPSULE | ORAL | 0 refills | Status: DC

## 2020-04-19 NOTE — Progress Notes (Signed)
Hematology/Oncology Consult note [facility] Telephone:([phone] [phone]   Patient Care Team: Rogelia Rohrer, MD as PCP - General (Internal Medicine)  REFERRING PROVIDER: [name], MD  CHIEF COMPLAINTS/REASON FOR VISIT:  Evaluation of pancreatic mass, weight loss, anorexia  HISTORY OF PRESENTING ILLNESS:   Chelsea Davis is a  84 y.o.  female with PMH listed below was seen in consultation at the request of  [name], MD  for evaluation of pancreatic mass, weight loss, anorexia  Patient has reported feeling fatigued, unintentional weight loss, poor oral intake, multiple episodes of diarrhea for the past few months.  She lives with her husband at home.  She is accompanied by daughter who is [employer].  Per daughter, patient has more than 10 episodes of loose stools every day, greasy, patient also reports flatulence, bloating, nominal cramps. Patient has a history of vulvar carcinoma status post resection at [facility].  Reviewed [facility] records. 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  Patient has been followed up at [facility] and was last seen by [facility] gynecology in 2019. Patient was seen by Dr.Cen and CT abdomen pelvis without contrast was obtained 04/13/2020 CT abdomen pelvis without contrast showed 7.9 x 4 x 4.6 cm mass in the tail of the pancreas, abutting and possibly invading the adjacent gastric wall, with adjacent pancreatic and also with pelvic adenopathy. Appearance favors adenocarcinoma of the pancreas.  The pelvic adenopathy and mild retroperitoneal adenopathy is a mildly unusual feature, raises the less likely possibilities of lymphoma with involvement of the pancreas.  Unusual heterogeneity in the portal vein/SMV with unusual prominence of the right portal vein on noncontrast imaging.  Suspicious for portal vein and SMV thrombosis. Perihepatic and  perisplenic ascites, coronary atherosclerosis, contrast media in the distal esophagus suggesting reflux or dysmotility.  Trace ascites in the pelvis.  Transitional S1 vertebra.  Mild foraminal impingement at L4-5 and L5-S1 due to spondylosis and degenerative disc disease.  Aortic atherosclerosis.   Review of Systems  Constitutional: Positive for appetite change, fatigue and unexpected weight change. Negative for chills and fever.  HENT:   Negative for hearing loss and voice change.   Eyes: Negative for eye problems.  Respiratory: Negative for chest tightness, cough and shortness of breath.   Cardiovascular: Negative for chest pain.  Gastrointestinal: Positive for diarrhea. Negative for abdominal distention, abdominal pain and blood in stool.  Endocrine: Negative for hot flashes.  Genitourinary: Negative for difficulty urinating and frequency.   Musculoskeletal: Positive for back pain. Negative for arthralgias.  Skin: Negative for itching and rash.  Neurological: Negative for extremity weakness.  Hematological: Negative for adenopathy.  Psychiatric/Behavioral: Negative for confusion.    MEDICAL HISTORY:  Past Medical History:  Diagnosis Date  . Benign tumor of labia majora    a. resected/[facility].  . Chronic diastolic CHF (congestive heart failure) ([place])    a. 03/2013 Neg MV;  b. 10/2013 Echo: EF 65-70%, LVH, diast dysfxn, Triv MR/TR, mildly dil LA.  . CKD (chronic kidney disease), stage III    a. 06/2013 Renal Duplex: nl renal arteries.  . Diabetes mellitus without complication ([place])   . Essential hypertension   . Hyperlipidemia     SURGICAL HISTORY: Past Surgical History:  Procedure Laterality Date  . CYST EXCISION    . hysterectomy    . VULVA SURGERY      SOCIAL HISTORY: Social History  Socioeconomic History  . Marital status: Married    Spouse name: Not on file  . Number of children: Not on file  . Years of education: Not on file  . Highest education level: Not on file    Occupational History  . Not on file  Tobacco Use  . Smoking status: Never Smoker  . Smokeless tobacco: Never Used  Vaping Use  . Vaping Use: Never used  Substance and Sexual Activity  . Alcohol use: No  . Drug use: No  . Sexual activity: Not on file  Other Topics Concern  . Not on file  Social History Narrative  . Not on file   Social Determinants of Health   Financial Resource Strain:   . Difficulty of Paying Living Expenses:   Food Insecurity:   . Worried About Charity fundraiser in the Last Year:   . Arboriculturist in the Last Year:   Transportation Needs:   . Film/video editor (Medical):   Marland Kitchen Lack of Transportation (Non-Medical):   Physical Activity:   . Days of Exercise per Week:   . Minutes of Exercise per Session:   Stress:   . Feeling of Stress :   Social Connections:   . Frequency of Communication with Friends and Family:   . Frequency of Social Gatherings with Friends and Family:   . Attends Religious Services:   . Active Member of Clubs or Organizations:   . Attends Archivist Meetings:   Marland Kitchen Marital Status:   Intimate Partner Violence:   . Fear of Current or Ex-Partner:   . Emotionally Abused:   Marland Kitchen Physically Abused:   . Sexually Abused:     FAMILY HISTORY: Family History  Problem Relation Age of Onset  . Hypertension Mother   . Hypertension Father   . Colon cancer Father   . Stomach cancer Sister     ALLERGIES:  is allergic to iodinated diagnostic agents, amlodipine, coreg [carvedilol], dye fdc red [red dye], iodides, metformin, and spironolactone.  MEDICATIONS:  Current Outpatient Medications  Medication Sig Dispense Refill  . Albuterol Sulfate (PROAIR HFA IN) Inhale 2 puffs into the lungs as needed (as needed for wheezing or shortness of breath).     Marland Kitchen amLODipine (NORVASC) 5 MG tablet Take 1 tablet (5 mg total) by mouth daily. (Patient not taking: Reported on 04/19/2020) 90 tablet 3  . amLODipine (NORVASC) 5 MG tablet Take 5 mg  by mouth daily. (Patient not taking: Reported on 04/19/2020)    . atorvastatin (LIPITOR) 20 MG tablet Take 20 mg by mouth daily. (Patient not taking: Reported on 04/19/2020)    . hydrALAZINE (APRESOLINE) 50 MG tablet Take 100 mg by mouth 3 (three) times daily. (Patient not taking: Reported on 04/19/2020)    . insulin NPH-regular Human (NOVOLIN 70/30) (70-30) 100 UNIT/ML injection Inject into the skin. Takes 50 units am and 14 units pm daily. (Patient not taking: Reported on 04/19/2020)    . lipase/protease/amylase (CREON) 36000 UNITS CPEP capsule Take 1 capsule (36,000 Units total) by mouth 3 (three) times daily with meals. May also take 1 capsule (36,000 Units total) as needed (with snacks). 21 capsule 0  . losartan (COZAAR) 100 MG tablet Take 100 mg by mouth daily. (Patient not taking: Reported on 04/19/2020)    . potassium chloride 20 MEQ/15ML (10%) SOLN Take 20 mEq by mouth daily. (Patient not taking: Reported on 04/19/2020)    . triamterene-hydrochlorothiazide (DYAZIDE) 37.5-25 MG capsule Take 2 capsules by  mouth daily. (Patient not taking: Reported on 04/19/2020)  11   No current facility-administered medications for this visit.     PHYSICAL EXAMINATION: ECOG PERFORMANCE STATUS: 2 - Symptomatic, <50% confined to bed Vitals:   04/19/20 1520  BP: 116/72  Pulse: 76  Resp: 18  Temp: (!) 95.7 F (35.4 C)   Filed Weights    Physical Exam Constitutional:      General: She is not in acute distress.    Comments: Frail appearance.  She sits in the wheelchair.  Thin built  HENT:     Head: Normocephalic and atraumatic.  Eyes:     General: No scleral icterus. Cardiovascular:     Rate and Rhythm: Normal rate and regular rhythm.     Heart sounds: Normal heart sounds.  Pulmonary:     Effort: Pulmonary effort is normal. No respiratory distress.     Breath sounds: No wheezing.  Abdominal:     General: Bowel sounds are normal. There is no distension.     Palpations: Abdomen is soft.    Musculoskeletal:        General: No deformity. Normal range of motion.     Cervical back: Normal range of motion and neck supple.  Skin:    General: Skin is warm and dry.     Findings: No erythema or rash.  Neurological:     Mental Status: She is alert and oriented to person, place, and time. Mental status is at baseline.     Cranial Nerves: No cranial nerve deficit.     Coordination: Coordination normal.  Psychiatric:        Mood and Affect: Mood normal.     LABORATORY DATA:  I have reviewed the data as listed Lab Results  Component Value Date   WBC 11.7 (H) 04/19/2020   HGB 13.2 04/19/2020   HCT 39.5 04/19/2020   MCV 89.2 04/19/2020   PLT 81 (L) 04/19/2020   Recent Labs    04/19/20 1603  NA 140  K 3.8  CL 105  CO2 21*  GLUCOSE 153*  BUN 38*  CREATININE 1.62*  CALCIUM 8.3*  GFRNONAA 29*  GFRAA 34*  PROT 7.4  ALBUMIN 2.8*  AST 17  ALT 19  ALKPHOS 70  BILITOT 1.6*   Iron/TIBC/Ferritin/ %Sat No results found for: IRON, TIBC, FERRITIN, IRONPCTSAT    RADIOGRAPHIC STUDIES: I have personally reviewed the radiological images as listed and agreed with the findings in the report. CT ABDOMEN PELVIS WO CONTRAST  Addendum Date: 04/13/2020   ADDENDUM REPORT: 04/13/2020 11:40 ADDENDUM: The original report was by Dr. Van Clines. The following addendum is by Dr. Van Clines: These results were called by telephone at the time of interpretation on 04/13/2020 at 11:23 am to provider Dr. Delight Stare , who verbally acknowledged these results. Please note also that the final sentence under impression # 1 should read, "The pelvic adenopathy and mild retroperitoneal adenopathy are mildly unusual features, and raise the less likely possibility of lymphoma with involvement of the pancreas AS OPPOSED TO pancreatic adenocarcinoma." Electronically Signed   By: Van Clines M.D.   On: 04/13/2020 11:40   Result Date: 04/13/2020 CLINICAL DATA:  Anorexia, bloating, diarrhea,  and abdominal pain over the last 2-3 weeks. Weakness. EXAM: CT ABDOMEN AND PELVIS WITHOUT CONTRAST TECHNIQUE: Multidetector CT imaging of the abdomen and pelvis was performed following the standard protocol without IV contrast. COMPARISON:  11/29/2013 FINDINGS: Lower chest: Mild scarring or atelectasis in the lingula. Contrast medium in  the distal esophagus suggesting reflux or dysmotility. Left anterior descending and right coronary artery atherosclerosis with atherosclerotic calcification of the descending thoracic aorta. Hepatobiliary: Unusual heterogeneity in the portal vein and SMV with unusual prominence of the right portal vein on noncontrast imaging, suspicious for portal vein thrombosis. The gallbladder appears unremarkable. Pancreas: 7.9 by 4.0 by 4.6 cm mass in the tail the pancreas, abutting and possibly invading the adjacent gastric wall. Adjacent peripancreatic adenopathy including a 1.9 cm peripancreatic node on image 28/2 and a 1.3 cm right gastric node on image 27/2. A peripancreatic node on image 34/2 measures 1.2 cm in diameter. Some of this indistinct adenopathy extends adjacent to the common hepatic artery on image 28/2. Please note that today's exam is a noncontrast exam and not well suited for the staging workup of pancreatic adenocarcinoma. Spleen: Unremarkable Adrenals/Urinary Tract: Unremarkable Stomach/Bowel: As noted above, the pancreatic tail mass abuts the posterior margin of the stomach. No dilated bowel. Vascular/Lymphatic: As noted above, concern for the possibility of portal vein and SMV thrombosis, although ultrasound or contrasted exam would be able to show this with more confidence. Aortoiliac atherosclerotic vascular disease. Peripancreatic adenopathy. Left external iliac node 1.0 cm in short axis, image 47/2. Right common iliac node 1.0 cm in short axis, image 50/2. Right external iliac node 1.4 cm in short axis, image 66/2. Left external iliac node 1.1 cm in short axis, image  63/2. Additional mild pelvic adenopathy is noted. Reproductive: Uterus absent.  Adnexa unremarkable. Other: Perihepatic and perisplenic ascites. Trace ascites in the pelvis. Musculoskeletal: Transitional S1 vertebra. Schmorl's node along the inferior endplate of L5. There is potentially mild bilateral foraminal impingement at L4-5 and L5-S1 due to spondylosis and degenerative disc disease. IMPRESSION: 1. 7.9 by 4.0 by 4.6 cm mass in the tail the pancreas, abutting and possibly invading the adjacent gastric wall, with adjacent peripancreatic and also with pelvic adenopathy. Appearance favors pancreatic adenocarcinoma. The pelvic adenopathy and mild retroperitoneal adenopathy is a mildly unusual feature, and raises the less likely possibility of lymphoma with involvement of the pancreas is post pancreatic adenocarcinoma. 2. Unusual heterogeneity in the portal vein and SMV with unusual prominence of the right portal vein on noncontrast imaging, suspicious for portal vein and SMV thrombosis. By report the patient has a history of CT contrast allergy, and laps from several years ago showed renal dysfunction. Particularly if the GFR is above 25, pancreatic protocol MRI with and without contrast should be considered. Alternative vascular workup of the portal vein could be performed using portal venous ultrasound. In the setting of oncology staging workup, nuclear medicine PET-CT might also play a complementary role. 3. Other imaging findings of potential clinical significance: Perihepatic and perisplenic ascites. Coronary atherosclerosis. Contrast medium in the distal esophagus suggesting reflux or dysmotility. Trace ascites in the pelvis. Transitional S1 vertebra. There is potentially mild bilateral foraminal impingement at L4-5 and L5-S1 due to spondylosis and degenerative disc disease. 4. Aortic atherosclerosis. Aortic Atherosclerosis (ICD10-I70.0). Radiology assistant personnel have been notified to put me in telephone  contact with the referring physician or the referring physician's clinical representative in order to discuss these findings. Once this communication is established I will issue an addendum to this report for documentation purposes. Electronically Signed: By: Van Clines M.D. On: 04/13/2020 11:15      ASSESSMENT & PLAN:  1. Pancreatic mass   2. Weight loss   3. Diarrhea, unspecified type   4. Goals of care, counseling/discussion   5. Thrombocytopenia ([place])    #  CT scan was independently reviewed by me and discussed with patient. Concerning for malignancy.,  Locally advanced pancreatic adenocarcinoma versus other malignancy, i.e. lymphoma, etc. Check CBC, CMP, flow cytometry, smear, CA 19-9, CEA, LDH, PT, PTT, C. difficile screening. Obtain PET scan ASAP. Discussed about the need about tissue diagnosis via biopsy.  Biopsy location to be determined after PET scan  #Possible portal vein thrombosis on noncontrast CT scan, She has CKD with estimated GFR 29.  Consider liver Doppler for further evaluation.  Weight loss, decreased oral intake. Encourage oral hydration.  She has history of CHF, currently off diuretics due to poor oral intake. Refer to nutritionist to establish care.  Diarrhea, check C. difficile stool.  If C. difficile is negative, will prescribe Imodium Malabsorption due to pancreatic insufficiency can be a possibility.  I sent a trial of Creon for her to take and see if any improvement.  #Chronic kidney disease, avoid nephrotoxins. # Lab showed thrombocytopenia, check immature platelet fraction, hepatitis panel, HIV, multiple myeloma panel,  Hyperbilirubinemia, check direct bilirubin, phosphorus.   Orders Placed This Encounter  Procedures  . C difficile quick screen w PCR reflex    Standing Status:   Future    Standing Expiration Date:   04/19/2021  . NM PET Image Initial (PI) Skull Base To Thigh    Standing Status:   Future    Standing Expiration Date:    04/19/2021    Order Specific Question:   ** REASON FOR EXAM (FREE TEXT)    Answer:   pancreatic cancer    Order Specific Question:   If indicated for the ordered procedure, I authorize the administration of a radiopharmaceutical per Radiology protocol    Answer:   Yes    Order Specific Question:   Preferred imaging location?    Answer:   [facility]    Order Specific Question:   Radiology Contrast Protocol - do NOT remove file path    Answer:   [email or web address]  . CBC with Differential/Platelet    Standing Status:   Future    Number of Occurrences:   1    Standing Expiration Date:   04/19/2021  . Comprehensive metabolic panel    Standing Status:   Future    Number of Occurrences:   1    Standing Expiration Date:   04/19/2021  . Flow cytometry panel-leukemia/lymphoma work-up  . Lactate dehydrogenase    Standing Status:   Future    Number of Occurrences:   1    Standing Expiration Date:   04/19/2021  . CEA    Standing Status:   Future    Number of Occurrences:   1    Standing Expiration Date:   04/19/2021  . Cancer antigen 19-9    Standing Status:   Future    Number of Occurrences:   1    Standing Expiration Date:   04/19/2021  . Protime-INR    Standing Status:   Future    Number of Occurrences:   1    Standing Expiration Date:   04/19/2021  . APTT    Standing Status:   Future    Number of Occurrences:   1    Standing Expiration Date:   04/19/2021  . Technologist smear review    Standing Status:   Future    Number of Occurrences:   1    Standing Expiration Date:   04/19/2021  . Amb Referral to Nutrition and Diabetic Education    Referral Priority:  Routine    Referral Type:   Consultation    Referral Reason:   Specialty Services Required    Number of Visits Requested:   1    All questions were answered. The patient knows to call the clinic with any problems questions or concerns.  cc [name], MD    Return of visit: TBD. Thank you  for this kind referral and the opportunity to participate in the care of this patient. A copy of today's note is routed to referring provider    Earlie Server, MD, PhD Hematology Oncology [facility] Pager- [phone] 04/19/2020

## 2020-04-19 NOTE — Progress Notes (Signed)
Pt here to establish care. Referred by Dr. Lennox Grumbles due to a mass on her pancreas ( was found in a recent CT scan last Thursday). All BP medications are currently on hold.

## 2020-04-20 ENCOUNTER — Telehealth: Payer: Self-pay

## 2020-04-20 LAB — PATHOLOGIST SMEAR REVIEW

## 2020-04-20 LAB — CANCER ANTIGEN 19-9: CA 19-9: 153 U/mL — ABNORMAL HIGH (ref 0–35)

## 2020-04-20 LAB — CEA: CEA: 4.1 ng/mL (ref 0.0–4.7)

## 2020-04-20 NOTE — Telephone Encounter (Signed)
Done Pt has been scheduled for lab/US as requested I was unable to reach pt nor her daughter by phone but a detailed message  was left on pts vmail making her aware of the scheduled appt location, dates and times. A reminder letter will be mailed out as well. (All instructions were highlight)

## 2020-04-20 NOTE — Telephone Encounter (Signed)
Please schedule her for lab and [place] as MD recommends.  Thanks!

## 2020-04-20 NOTE — Telephone Encounter (Signed)
-----   Message from Earlie Server, MD sent at 04/19/2020  8:57 PM EDT ----- Reviewed her blood work. I ordered another battery of testing for further work up. Please contact labs to see if they are able to add on any. Add on all labs that can be added on.  And arrange her to do additional lab encounter to do rest of the labs when she comes to do PET or [place].  Please arrange her to do liver doppler too. All orders are in. Thanks.

## 2020-04-21 ENCOUNTER — Encounter: Payer: Self-pay | Admitting: Oncology

## 2020-04-21 ENCOUNTER — Other Ambulatory Visit: Payer: Self-pay

## 2020-04-21 DIAGNOSIS — K8689 Other specified diseases of pancreas: Secondary | ICD-10-CM | POA: Diagnosis not present

## 2020-04-21 LAB — COMP PANEL: LEUKEMIA/LYMPHOMA

## 2020-04-21 LAB — C DIFFICILE QUICK SCREEN W PCR REFLEX
C Diff antigen: NEGATIVE
C Diff interpretation: NOT DETECTED
C Diff toxin: NEGATIVE

## 2020-04-24 ENCOUNTER — Other Ambulatory Visit: Payer: Self-pay

## 2020-04-24 ENCOUNTER — Encounter
Admission: RE | Admit: 2020-04-24 | Discharge: 2020-04-24 | Disposition: A | Discharge status: Home / Self Care | Payer: Medicare Other | Source: Ambulatory Visit | Attending: Oncology | Admitting: Oncology

## 2020-04-24 DIAGNOSIS — I63443 Cerebral infarction due to embolism of bilateral cerebellar arteries: Secondary | ICD-10-CM | POA: Diagnosis not present

## 2020-04-24 DIAGNOSIS — K8689 Other specified diseases of pancreas: Secondary | ICD-10-CM

## 2020-04-24 DIAGNOSIS — I639 Cerebral infarction, unspecified: Secondary | ICD-10-CM | POA: Diagnosis not present

## 2020-04-24 LAB — GLUCOSE, CAPILLARY: Glucose-Capillary: 130 mg/dL — ABNORMAL HIGH (ref 70–99)

## 2020-04-24 MED ORDER — FLUDEOXYGLUCOSE F - 18 (FDG) INJECTION
8.2500 | Freq: Once | INTRAVENOUS | Status: AC | PRN
  Administered 2020-04-24: 8.25 via INTRAVENOUS

## 2020-04-25 ENCOUNTER — Observation Stay: Payer: Medicare Other

## 2020-04-25 ENCOUNTER — Other Ambulatory Visit: Payer: Self-pay

## 2020-04-25 ENCOUNTER — Emergency Department: Payer: Medicare Other

## 2020-04-25 ENCOUNTER — Inpatient Hospital Stay
Admission: EM | Admit: 2020-04-25 | Discharge: 2020-04-27 | DRG: 065 | Disposition: A | Discharge status: Home Health | Payer: Medicare Other | Attending: Internal Medicine | Admitting: Internal Medicine

## 2020-04-25 DIAGNOSIS — Z515 Encounter for palliative care: Secondary | ICD-10-CM | POA: Diagnosis present

## 2020-04-25 DIAGNOSIS — Z800 Family history of malignant neoplasm of digestive organs: Secondary | ICD-10-CM

## 2020-04-25 DIAGNOSIS — E1129 Type 2 diabetes mellitus with other diabetic kidney complication: Secondary | ICD-10-CM | POA: Diagnosis present

## 2020-04-25 DIAGNOSIS — G8194 Hemiplegia, unspecified affecting left nondominant side: Secondary | ICD-10-CM | POA: Diagnosis present

## 2020-04-25 DIAGNOSIS — R29722 NIHSS score 22: Secondary | ICD-10-CM | POA: Diagnosis not present

## 2020-04-25 DIAGNOSIS — Z79899 Other long term (current) drug therapy: Secondary | ICD-10-CM

## 2020-04-25 DIAGNOSIS — N179 Acute kidney failure, unspecified: Secondary | ICD-10-CM | POA: Diagnosis not present

## 2020-04-25 DIAGNOSIS — I130 Hypertensive heart and chronic kidney disease with heart failure and stage 1 through stage 4 chronic kidney disease, or unspecified chronic kidney disease: Secondary | ICD-10-CM | POA: Diagnosis present

## 2020-04-25 DIAGNOSIS — D6859 Other primary thrombophilia: Secondary | ICD-10-CM | POA: Diagnosis present

## 2020-04-25 DIAGNOSIS — D696 Thrombocytopenia, unspecified: Secondary | ICD-10-CM | POA: Diagnosis present

## 2020-04-25 DIAGNOSIS — I422 Other hypertrophic cardiomyopathy: Secondary | ICD-10-CM | POA: Diagnosis present

## 2020-04-25 DIAGNOSIS — E782 Mixed hyperlipidemia: Secondary | ICD-10-CM | POA: Diagnosis not present

## 2020-04-25 DIAGNOSIS — I5032 Chronic diastolic (congestive) heart failure: Secondary | ICD-10-CM | POA: Diagnosis present

## 2020-04-25 DIAGNOSIS — Z66 Do not resuscitate: Secondary | ICD-10-CM | POA: Diagnosis present

## 2020-04-25 DIAGNOSIS — Z8249 Family history of ischemic heart disease and other diseases of the circulatory system: Secondary | ICD-10-CM

## 2020-04-25 DIAGNOSIS — I10 Essential (primary) hypertension: Secondary | ICD-10-CM | POA: Diagnosis not present

## 2020-04-25 DIAGNOSIS — N1831 Chronic kidney disease, stage 3a: Secondary | ICD-10-CM | POA: Diagnosis present

## 2020-04-25 DIAGNOSIS — R29707 NIHSS score 7: Secondary | ICD-10-CM | POA: Diagnosis present

## 2020-04-25 DIAGNOSIS — I639 Cerebral infarction, unspecified: Secondary | ICD-10-CM | POA: Diagnosis not present

## 2020-04-25 DIAGNOSIS — Z20822 Contact with and (suspected) exposure to covid-19: Secondary | ICD-10-CM | POA: Diagnosis present

## 2020-04-25 DIAGNOSIS — K8689 Other specified diseases of pancreas: Secondary | ICD-10-CM | POA: Diagnosis present

## 2020-04-25 DIAGNOSIS — E785 Hyperlipidemia, unspecified: Secondary | ICD-10-CM | POA: Diagnosis present

## 2020-04-25 DIAGNOSIS — D72829 Elevated white blood cell count, unspecified: Secondary | ICD-10-CM | POA: Diagnosis present

## 2020-04-25 DIAGNOSIS — E11649 Type 2 diabetes mellitus with hypoglycemia without coma: Secondary | ICD-10-CM | POA: Diagnosis present

## 2020-04-25 DIAGNOSIS — I63443 Cerebral infarction due to embolism of bilateral cerebellar arteries: Principal | ICD-10-CM | POA: Diagnosis present

## 2020-04-25 DIAGNOSIS — E1122 Type 2 diabetes mellitus with diabetic chronic kidney disease: Secondary | ICD-10-CM | POA: Diagnosis present

## 2020-04-25 LAB — CBC WITH DIFFERENTIAL/PLATELET
Abs Immature Granulocytes: 0.1 10*3/uL — ABNORMAL HIGH (ref 0.00–0.07)
Basophils Absolute: 0 10*3/uL (ref 0.0–0.1)
Basophils Relative: 0 %
Eosinophils Absolute: 0 10*3/uL (ref 0.0–0.5)
Eosinophils Relative: 0 %
HCT: 41.3 % (ref 36.0–46.0)
Hemoglobin: 13.5 g/dL (ref 12.0–15.0)
Immature Granulocytes: 1 %
Lymphocytes Relative: 18 %
Lymphs Abs: 2.2 10*3/uL (ref 0.7–4.0)
MCH: 30.8 pg (ref 26.0–34.0)
MCHC: 32.7 g/dL (ref 30.0–36.0)
MCV: 94.3 fL (ref 80.0–100.0)
Monocytes Absolute: 0.6 10*3/uL (ref 0.1–1.0)
Monocytes Relative: 5 %
Neutro Abs: 9.6 10*3/uL — ABNORMAL HIGH (ref 1.7–7.7)
Neutrophils Relative %: 76 %
Platelets: 74 10*3/uL — ABNORMAL LOW (ref 150–400)
RBC: 4.38 MIL/uL (ref 3.87–5.11)
RDW: 24 % — ABNORMAL HIGH (ref 11.5–15.5)
Smear Review: NORMAL
WBC: 12.6 10*3/uL — ABNORMAL HIGH (ref 4.0–10.5)
nRBC: 0.5 % — ABNORMAL HIGH (ref 0.0–0.2)

## 2020-04-25 LAB — COMPREHENSIVE METABOLIC PANEL
ALT: 21 U/L (ref 0–44)
AST: 23 U/L (ref 15–41)
Albumin: 2.6 g/dL — ABNORMAL LOW (ref 3.5–5.0)
Alkaline Phosphatase: 63 U/L (ref 38–126)
Anion gap: 15 (ref 5–15)
BUN: 48 mg/dL — ABNORMAL HIGH (ref 8–23)
CO2: 22 mmol/L (ref 22–32)
Calcium: 8.5 mg/dL — ABNORMAL LOW (ref 8.9–10.3)
Chloride: 104 mmol/L (ref 98–111)
Creatinine, Ser: 1.81 mg/dL — ABNORMAL HIGH (ref 0.44–1.00)
GFR calc Af Amer: 30 mL/min — ABNORMAL LOW (ref >60)
GFR calc non Af Amer: 26 mL/min — ABNORMAL LOW (ref >60)
Glucose, Bld: 125 mg/dL — ABNORMAL HIGH (ref 70–99)
Potassium: 4.7 mmol/L (ref 3.5–5.1)
Sodium: 141 mmol/L (ref 135–145)
Total Bilirubin: 2.1 mg/dL — ABNORMAL HIGH (ref 0.3–1.2)
Total Protein: 7 g/dL (ref 6.5–8.1)

## 2020-04-25 LAB — APTT: aPTT: 32 seconds (ref 24–36)

## 2020-04-25 LAB — PROTIME-INR
INR: 1.5 — ABNORMAL HIGH (ref 0.8–1.2)
Prothrombin Time: 17.1 seconds — ABNORMAL HIGH (ref 11.4–15.2)

## 2020-04-25 LAB — GLUCOSE, CAPILLARY
Glucose-Capillary: 124 mg/dL — ABNORMAL HIGH (ref 70–99)
Glucose-Capillary: 105 mg/dL — ABNORMAL HIGH (ref 70–99)
Glucose-Capillary: 108 mg/dL — ABNORMAL HIGH (ref 70–99)

## 2020-04-25 LAB — LACTATE DEHYDROGENASE: LDH: 329 U/L — ABNORMAL HIGH (ref 98–192)

## 2020-04-25 LAB — SARS CORONAVIRUS 2 BY RT PCR (HOSPITAL ORDER, PERFORMED IN ~~LOC~~ HOSPITAL LAB): SARS Coronavirus 2: NEGATIVE

## 2020-04-25 LAB — PATHOLOGIST SMEAR REVIEW

## 2020-04-25 LAB — BRAIN NATRIURETIC PEPTIDE: B Natriuretic Peptide: 560.1 pg/mL — ABNORMAL HIGH (ref 0.0–100.0)

## 2020-04-25 MED ORDER — INSULIN ASPART 100 UNIT/ML ~~LOC~~ SOLN: 0.0000 [IU] | Freq: Three times a day (TID) | SUBCUTANEOUS | Status: DC

## 2020-04-25 MED ORDER — ASPIRIN 300 MG RE SUPP
300.0000 mg | Freq: Every day | RECTAL | Status: DC
  Administered 2020-04-25 – 2020-04-26 (×2): 300 mg via RECTAL

## 2020-04-25 MED ORDER — STROKE: EARLY STAGES OF RECOVERY BOOK
Freq: Once | Status: AC
  Administered 2020-04-25: 1

## 2020-04-25 MED ORDER — SODIUM CHLORIDE 0.9 % IV SOLN: Freq: Once | INTRAVENOUS | Status: AC

## 2020-04-25 MED ORDER — SODIUM CHLORIDE 0.9 % IV SOLN: INTRAVENOUS | Status: DC

## 2020-04-25 MED ORDER — HYDRALAZINE HCL 20 MG/ML IJ SOLN: 5.0000 mg | INTRAMUSCULAR | Status: DC | PRN

## 2020-04-25 MED ORDER — ALBUTEROL SULFATE (2.5 MG/3ML) 0.083% IN NEBU: 3.0000 mL | INHALATION_SOLUTION | Freq: Four times a day (QID) | RESPIRATORY_TRACT | Status: DC | PRN

## 2020-04-25 MED ORDER — ACETAMINOPHEN 160 MG/5ML PO SOLN
650.0000 mg | ORAL | Status: DC | PRN
  Filled 2020-04-25: qty 20.3

## 2020-04-25 MED ORDER — INSULIN ASPART 100 UNIT/ML ~~LOC~~ SOLN: 0.0000 [IU] | Freq: Every day | SUBCUTANEOUS | Status: DC

## 2020-04-25 MED ORDER — ASPIRIN EC 325 MG PO TBEC: 325.0000 mg | DELAYED_RELEASE_TABLET | Freq: Every day | ORAL | Status: DC

## 2020-04-25 MED ORDER — ACETAMINOPHEN 325 MG PO TABS: 650.0000 mg | ORAL_TABLET | ORAL | Status: DC | PRN

## 2020-04-25 MED ORDER — SENNOSIDES-DOCUSATE SODIUM 8.6-50 MG PO TABS: 1.0000 | ORAL_TABLET | Freq: Every evening | ORAL | Status: DC | PRN

## 2020-04-25 MED ORDER — ATORVASTATIN CALCIUM 20 MG PO TABS: 20.0000 mg | ORAL_TABLET | Freq: Every day | ORAL | Status: DC

## 2020-04-25 MED ORDER — ONDANSETRON HCL 4 MG/2ML IJ SOLN: 4.0000 mg | Freq: Three times a day (TID) | INTRAMUSCULAR | Status: DC | PRN

## 2020-04-25 MED ORDER — ACETAMINOPHEN 650 MG RE SUPP: 650.0000 mg | RECTAL | Status: DC | PRN

## 2020-04-25 MED ORDER — ASPIRIN 81 MG PO CHEW: 324.0000 mg | CHEWABLE_TABLET | Freq: Once | ORAL | Status: DC

## 2020-04-25 NOTE — H&P (Signed)
History and Physical    Chelsea Davis WNU:[id number] DOB: [birth year] DOA: 04/25/2020  Referring MD/NP/PA:   PCP: [name], MD   Patient coming from:  The patient is coming from home.  At baseline, pt is independent for most of ADL.        Chief Complaint: Left-sided weakness, slurred speech  HPI: Chelsea Davis is a 84 y.o. female with medical history significant of hypertension, hyperlipidemia, diabetes mellitus, CKD-3, dCHF, hypothyroidism, malignant neoplasm of vulva (s/p of surgery in [facility]), pancreatic mass, who presents with left-sided weakness and slurred speech.  Per his son, patient was last known normal at about 1 AM, this early morning, patient was noted to have slurred speech, left facial droop and left-sided weakness.  No vision loss or hearing loss.  Patient does not have chest pain, shortness breath, cough.  No fever or chills.  Denies nausea, vomiting, diarrhea, abdominal pain, symptoms of UTI.  Of note, patient was recently found to have pancreatic mass.  She had PET scan yesterday, the findings are with high suspicion for malignancy.   ED Course: pt was found to have WBC 12.6, BNP 560, pending COVID-19 PCR, pending urinalysis, thrombocytopenia with platelet of 74, worsening renal function, temperature normal, blood pressure 157/59, RR 61, oxygen sat 99% on room air.  CT head negative.  MRI showed acute stroke.  Patient is placed on MedSurg bed for observation.  Dr. Doy Mince of neurology is consulted.  MRI-brain: Multifocal cerebral and cerebellar acute infarcts, likely embolic with involvement of the left caudate and right hippocampal tail. The largest infarct involves the right corona radiata.  Mild cerebral atrophy. Mild background chronic microvascular ischemic changes.  Indeterminate left cheek cystic lesion. Correlation with patient history and physical exam is recommended. Consider outpatient dermatology consult.  Review of Systems:   General: no  fevers, chills, no body weight gain, has fatigue HEENT: no blurry vision, hearing changes or sore throat Respiratory: no dyspnea, coughing, wheezing CV: no chest pain, no palpitations GI: no nausea, vomiting, abdominal pain, diarrhea, constipation GU: no dysuria, burning on urination, increased urinary frequency, hematuria  Ext: has leg edema Neuro: No vision change or hearing loss.  Has slurry speech, left facial droop, left-sided weakness Skin: no rash, no skin tear. MSK: No muscle spasm, no deformity, no limitation of range of movement in spin Heme: No easy bruising.  Travel history: No recent long distant travel.  Allergy:  Allergies  Allergen Reactions  . Iodinated Diagnostic Agents Hives, Rash and Swelling  . Amlodipine     Other reaction(s): Unknown edema  . Coreg [Carvedilol]     Bradycardia   . Dye Fdc Red [Red Dye]   . Iodides Hives, Rash and Swelling  . Metformin Diarrhea  . Spironolactone Rash    Past Medical History:  Diagnosis Date  . Benign tumor of labia majora    a. resected/[facility].  . Chronic diastolic CHF (congestive heart failure) ([place])    a. 03/2013 Neg MV;  b. 10/2013 Echo: EF 65-70%, LVH, diast dysfxn, Triv MR/TR, mildly dil LA.  . CKD (chronic kidney disease), stage III    a. 06/2013 Renal Duplex: nl renal arteries.  . Diabetes mellitus without complication ([place])   . Essential hypertension   . Hyperlipidemia     Past Surgical History:  Procedure Laterality Date  . CYST EXCISION    . hysterectomy    . VULVA SURGERY      Social History:  reports that she has never smoked.  She has never used smokeless tobacco. She reports that she does not drink alcohol and does not use drugs.  Family History:  Family History  Problem Relation Age of Onset  . Hypertension Mother   . Hypertension Father   . Colon cancer Father   . Stomach cancer Sister      Prior to Admission medications   Medication Sig Start Date End Date Taking? Authorizing Provider    Albuterol Sulfate (PROAIR HFA IN) Inhale 2 puffs into the lungs as needed (as needed for wheezing or shortness of breath).     [provider]  amLODipine (NORVASC) 5 MG tablet Take 1 tablet (5 mg total) by mouth daily. Patient not taking: Reported on 04/19/2020 07/24/17 09/18/18  Rise Mu, PA-C  amLODipine (NORVASC) 5 MG tablet Take 5 mg by mouth daily. Patient not taking: Reported on 04/19/2020 12/28/18   [provider]  atorvastatin (LIPITOR) 20 MG tablet Take 20 mg by mouth daily. Patient not taking: Reported on 04/19/2020    [provider]  hydrALAZINE (APRESOLINE) 50 MG tablet Take 100 mg by mouth 3 (three) times daily. Patient not taking: Reported on 04/19/2020    [provider]  insulin NPH-regular Human (NOVOLIN 70/30) (70-30) 100 UNIT/ML injection Inject into the skin. Takes 50 units am and 14 units pm daily. Patient not taking: Reported on 04/19/2020    [provider]  lipase/protease/amylase (CREON) 36000 UNITS CPEP capsule Take 1 capsule (36,000 Units total) by mouth 3 (three) times daily with meals. May also take 1 capsule (36,000 Units total) as needed (with snacks). 04/19/20   Earlie Server, MD  losartan (COZAAR) 100 MG tablet Take 100 mg by mouth daily. Patient not taking: Reported on 04/19/2020    [provider]  potassium chloride 20 MEQ/15ML (10%) SOLN Take 20 mEq by mouth daily. Patient not taking: Reported on 04/19/2020    [provider]  triamterene-hydrochlorothiazide (DYAZIDE) 37.5-25 MG capsule Take 2 capsules by mouth daily. Patient not taking: Reported on 04/19/2020 11/02/15   [provider]    Physical Exam: Vitals:   04/25/20 1119 04/25/20 1230 04/25/20 1300 04/25/20 1330  BP:  (!) 144/104 139/67 (!) 148/56  Pulse:  69 66 65  Temp:      TempSrc:      SpO2:  100% 100% 100%  Weight: 47.6 kg     Height: 4\' 8"  (1.422 m)      General: Not in acute distress HEENT:       Eyes: PERRL, EOMI, no  scleral icterus.       ENT: No discharge from the ears and nose, no pharynx injection, no tonsillar enlargement.        Neck: No JVD, no bruit, no mass felt. Heme: No neck lymph node enlargement. Cardiac: S1/S2, RRR, No murmurs, No gallops or rubs. Respiratory:  No rales, wheezing, rhonchi or rubs. GI: Soft, nondistended, nontender, no rebound pain, no organomegaly, BS present. GU: No hematuria Ext: has trace leg edema bilaterally. 1+DP/PT pulse bilaterally. Musculoskeletal: No joint deformities, No joint redness or warmth, no limitation of ROM in spin. Skin: No rashes.  Neuro: Alert, oriented X3, cranial nerves II-XII grossly intact except for left facial droop. Muscle strength 2/5 in left arm and 3/5 in left leg; 5/5 in right extremities, sensation to light touch intact.  Psych: Patient is not psychotic, no suicidal or hemocidal ideation.  Labs on Admission: I have personally reviewed following labs and imaging studies  CBC: Recent Labs  Lab 04/19/20 1603 04/25/20 1126  WBC 11.7* 12.6*  NEUTROABS 9.8* 9.6*  HGB 13.2 13.5  HCT 39.5 41.3  MCV 89.2 94.3  PLT 81* 74*   Basic Metabolic Panel: Recent Labs  Lab 04/19/20 1603 04/25/20 1126  NA 140 141  K 3.8 4.7  CL 105 104  CO2 21* 22  GLUCOSE 153* 125*  BUN 38* 48*  CREATININE 1.62* 1.81*  CALCIUM 8.3* 8.5*   GFR: Estimated Creatinine Clearance: 15.4 mL/min (A) (by C-G formula based on SCr of 1.81 mg/dL (H)). Liver Function Tests: Recent Labs  Lab 04/19/20 1603 04/25/20 1126  AST 17 23  ALT 19 21  ALKPHOS 70 63  BILITOT 1.6* 2.1*  PROT 7.4 7.0  ALBUMIN 2.8* 2.6*   No results for input(s): LIPASE, AMYLASE in the last 168 hours. No results for input(s): AMMONIA in the last 168 hours. Coagulation Profile: Recent Labs  Lab 04/19/20 1603  INR 1.3*   Cardiac Enzymes: No results for input(s): CKTOTAL, CKMB, CKMBINDEX, TROPONINI in the last 168 hours. BNP (last 3 results) No results for input(s): PROBNP in the  last 8760 hours. HbA1C: No results for input(s): HGBA1C in the last 72 hours. CBG: Recent Labs  Lab 04/24/20 1243 04/25/20 1116 04/25/20 1711  GLUCAP 130* 124* 105*   Lipid Profile: No results for input(s): CHOL, HDL, LDLCALC, TRIG, CHOLHDL, LDLDIRECT in the last 72 hours. Thyroid Function Tests: No results for input(s): TSH, T4TOTAL, FREET4, T3FREE, THYROIDAB in the last 72 hours. Anemia Panel: No results for input(s): VITAMINB12, FOLATE, FERRITIN, TIBC, IRON, RETICCTPCT in the last 72 hours. Urine analysis:    Component Value Date/Time   COLORURINE Straw 11/29/2013 1026   APPEARANCEUR Clear 11/29/2013 1026   LABSPEC 1.006 11/29/2013 1026   PHURINE 7.0 11/29/2013 1026   GLUCOSEU 50 mg/dL 11/29/2013 1026   HGBUR Negative 11/29/2013 1026   BILIRUBINUR Negative 11/29/2013 1026   KETONESUR Trace 11/29/2013 1026   PROTEINUR Negative 11/29/2013 1026   NITRITE Negative 11/29/2013 1026   LEUKOCYTESUR Negative 11/29/2013 1026   Sepsis Labs: @LABRCNTIP (procalcitonin:4,lacticidven:4) ) Recent Results (from the past 240 hour(s))  C difficile quick screen w PCR reflex     Status: None   Collection Time: 04/21/20  9:16 AM   Specimen: STOOL  Result Value Ref Range Status   C Diff antigen NEGATIVE NEGATIVE Final   C Diff toxin NEGATIVE NEGATIVE Final   C Diff interpretation No C. difficile detected.  Final    Comment: Performed at [facility], [address] [place], [place] [address]  SARS Coronavirus 2 by RT PCR (hospital order, performed in [facility] hospital lab) Nasopharyngeal Nasopharyngeal Swab     Status: None   Collection Time: 04/25/20 11:26 AM   Specimen: Nasopharyngeal Swab  Result Value Ref Range Status   SARS Coronavirus 2 NEGATIVE NEGATIVE Final    Comment: (NOTE) SARS-CoV-2 target nucleic acids are NOT DETECTED.  The SARS-CoV-2 RNA is generally detectable in upper and lower respiratory specimens during the acute phase of infection. The  lowest concentration of SARS-CoV-2 viral copies this assay can detect is 250 copies / mL. A negative result does not preclude SARS-CoV-2 infection and should not be used as the sole basis for treatment or other patient management decisions.  A negative result may occur with improper specimen collection / handling, submission of specimen other than nasopharyngeal swab, presence of viral mutation(s) within the areas targeted by this assay, and inadequate number of viral copies (<250 copies / mL). A  negative result must be combined with clinical observations, patient history, and epidemiological information.  Fact Sheet for Patients:   StrictlyIdeas.no  Fact Sheet for Healthcare Providers: BankingDealers.co.za  This test is not yet approved or  cleared by the [place] FDA and has been authorized for detection and/or diagnosis of SARS-CoV-2 by FDA under an Emergency Use Authorization (EUA).  This EUA will remain in effect (meaning this test can be used) for the duration of the COVID-19 declaration under Section 564(b)(1) of the Act, 21 U.S.C. section 360bbb-3(b)(1), unless the authorization is terminated or revoked sooner.  Performed at [facility], [address] [place], [place] [address]      Radiological Exams on Admission: CT HEAD WO CONTRAST  Result Date: 04/25/2020 CLINICAL DATA:  Left-sided facial droop and slurred speech. EXAM: CT HEAD WITHOUT CONTRAST TECHNIQUE: Contiguous axial images were obtained from the base of the skull through the vertex without intravenous contrast. COMPARISON:  CT scan 01/01/2018 FINDINGS: Brain: Stable mild age related cerebral atrophy, ventriculomegaly and periventricular white matter disease. Stable bilateral basal ganglia calcifications. No extra-axial fluid collections are identified. No CT findings for acute hemispheric infarction or intracranial hemorrhage. No mass lesions. The  brainstem and cerebellum are normal. Vascular: Stable vascular calcifications. No aneurysm or hyperdense vessels. Skull: No skull fracture or bone lesions. Sinuses/Orbits: The paranasal sinuses and mastoid air cells are clear. The globes are intact. Other: No scalp lesions or hematoma. IMPRESSION: 1. Stable mild age related cerebral atrophy, ventriculomegaly and periventricular white matter disease. 2. No acute intracranial findings or mass lesions. Electronically Signed   By: Marijo Sanes M.D.   On: 04/25/2020 12:20   MR ANGIO HEAD WO CONTRAST  Result Date: 04/25/2020 CLINICAL DATA:  Stroke EXAM: MRA HEAD WITHOUT CONTRAST TECHNIQUE: Angiographic images of the Circle of Willis were obtained using MRA technique without intravenous contrast. COMPARISON:  MRI head 04/25/2020 FINDINGS: Both vertebral arteries patent to the basilar. Mild stenosis distal left vertebral artery. Right PICA patent. Left PICA not visualized. Basilar widely patent. Superior cerebellar and posterior cerebral arteries patent bilaterally. No significant stenosis. Fetal origin left posterior cerebral artery. Irregular narrowing of the internal carotid artery at the skull base on the left may be due to atherosclerotic disease or artifact. Right internal carotid artery widely patent. Cavernous carotid patent bilaterally. Anterior and middle cerebral arteries patent bilaterally. Irregularity of MCA branches bilaterally likely due to atherosclerotic disease. Mild irregularity in the anterior cerebral arteries. IMPRESSION: Negative for large vessel occlusion Atherosclerotic irregularity in the anterior and middle cerebral arteries bilaterally. Electronically Signed   By: Franchot Gallo M.D.   On: 04/25/2020 17:55   MR BRAIN WO CONTRAST  Result Date: 04/25/2020 CLINICAL DATA:  Focal neurological deficit EXAM: MRI HEAD WITHOUT CONTRAST TECHNIQUE: Multiplanar, multiecho pulse sequences of the brain and surrounding structures were obtained without  intravenous contrast. COMPARISON:  Prior same day head CT. FINDINGS: Brain: Multifocal scattered bilateral cerebral and cerebellar foci of restricted diffusion involving the left caudate, bilateral frontoparietal and occipital regions. The largest acute infarct involves the right corona radiata (5:34). There are also acute infarcts involving the right hippocampal tail and possibly dorsal left temporal region. Mild cerebral atrophy with ex vacuo dilatation. Background scattered T2/FLAIR hyperintense foci involving the periventricular white matter are nonspecific however commonly associated with chronic microvascular ischemic changes. No midline shift, mass lesion or extra-axial fluid collection. No intracranial hemorrhage. Vascular: Grossly normal flow voids. Skull and upper cervical spine: Normal marrow signal. Sinuses/Orbits: Normal orbits. Clear paranasal  sinuses. No mastoid effusion. Other: Partially imaged left cheek cystic lesion. IMPRESSION: Multifocal cerebral and cerebellar acute infarcts, likely embolic with involvement of the left caudate and right hippocampal tail. The largest infarct involves the right corona radiata. Mild cerebral atrophy. Mild background chronic microvascular ischemic changes. Indeterminate left cheek cystic lesion. Correlation with patient history and physical exam is recommended. Consider outpatient dermatology consult. These results were called by telephone at the time of interpretation on 04/25/2020 at 2:31 pm to provider Lenise Arena , who verbally acknowledged these results. Electronically Signed   By: Primitivo Gauze M.D.   On: 04/25/2020 14:36   NM PET Image Initial (PI) Skull Base To Thigh  Result Date: 04/25/2020 CLINICAL DATA:  Initial treatment strategy for pancreatic tail mass. Remote history of vulvar squamous cell carcinoma. EXAM: NUCLEAR MEDICINE PET SKULL BASE TO THIGH TECHNIQUE: 8.3 mCi F-18 FDG was injected intravenously. Full-ring PET imaging was  performed from the skull base to thigh after the radiotracer. CT data was obtained and used for attenuation correction and anatomic localization. Fasting blood glucose: 130 mg/dl COMPARISON:  04/13/2020 CT scan FINDINGS: Mediastinal blood pool activity: SUV max 3.8 Liver activity: SUV max 4.6 NECK: 3.0 by 2.9 cm solid left thyroid nodule has similar activity to the rest of thyroid is not hypermetabolic. This was previously shown on the CT cervical spine from 01/01/2018 and thyroid ultrasound was recommended at that time. This has been evaluated on previous imaging, although correlation with patient history is recommended to ensure that the recommended thyroid workup was performed. (Ref: J Am Coll Radiol. 2015 Feb;12(2): 143-50). Incidental CT findings: Small subcutaneous cystic lesion along the left face in similar lesions along the left upper back near the base of the neck, probably sebaceous cysts or similar benign lesions. CHEST: Right axillary lymph node 0.8 cm in short axis on image 75/4 with maximum SUV 6.0. Fullness of density along the left axillary neurovascular structures probably reflecting an adjacent lymph node, maximum SUV is only 2.7. Incidental CT findings: Coronary, aortic arch, and branch vessel atherosclerotic vascular disease. Mild cardiomegaly. Trace left pleural effusion and trace pericardial effusion. ABDOMEN/PELVIS: Abnormal expansion of the pancreatic tail corresponding to a mass with central necrosis and hypermetabolic rim, versus a mass associated with marked dilatation of the dorsal pancreatic duct. The abnormal hypermetabolic lesion has a maximum SUV of 10.0 and measures approximately 9.1 by 5.0 by 5.6 cm There is some accentuated signal tracking in the portal vein and superior mesenteric vein suspicious for tumor thrombus, maximum SUV 5.6 which is substantially higher than the normal blood pool. Small but hypermetabolic retrocrural, periaortic, common iliac, internal iliac, and external  iliac adenopathy. Index retrocaval node measuring 0.8 cm in short axis on image 148/4, maximum SUV 8.7. Index left external iliac node 1.0 cm in short axis on image 200/4, maximum SUV 13.0. Index right external iliac node 1.2 cm in short axis on image 206/4, maximum SUV 12.0. Scattered accentuated metabolic activity in loops of bowel. The only region of moderate suspicion is the focal circumferential activity in the rectum corresponding to potential soft tissue prominence in the rectum in the vicinity of image 210/4, maximum SUV 19.6 Incidental CT findings: Aortoiliac atherosclerotic vascular disease. Mild ascites most notable in the perihepatic region. SKELETON: No significant abnormal hypermetabolic activity in this region. Incidental CT findings: none IMPRESSION: 1. Unusual pattern of hypermetabolic adenopathy in the abdomen and pelvis along with a large centrally necrotic or cystic mass in the pancreatic tail with hypermetabolic margins. Suspected  tumor thrombus in the portal vein and possibly the superior mesenteric vein. High suspicion for malignancy but this would be an unusual pattern for typical pancreatic adenocarcinoma. There is also focal hypermetabolic activity in a region of soft tissue prominence in the rectum which could be a rectal adenocarcinoma. Differential diagnostic possibilities include metastatic rectal cancer, an unusual spread of pancreatic malignancy (with the rectal activity being spurious/physiologic), or lymphoma with pancreatic and possibly rectal involvement. If not recently performed, colonoscopy or at least sigmoidoscopy would be recommended to assess the potential mid rectal mass. Assuming that standard pancreatic protocol MRI with and without contrast cannot be performed for further characterization of the pancreatic mass, tissue diagnosis from the pancreas may be warranted. 2. There is also a hypermetabolic right axillary lymph node concerning for metastatic spread. Equivocal  soft tissue density along the left axillary neurovascular structures without hypermetabolic activity. 3. Left thyroid nodule similar to appearance on 01/01/2018. This has been evaluated on previous imaging, at the time of prior characterization on 01/01/2018 a thyroid ultrasound was recommended, correlate with patient history in determining whether this was performed. (Ref: J Am Coll Radiol. 2015 Feb;12(2): 143-50). 4. Other imaging findings of potential clinical significance: Aortic Atherosclerosis (ICD10-I70.0). Coronary atherosclerosis with mild cardiomegaly. Trace left pleural effusion with trace pericardial effusion. Small amount of ascites. Electronically Signed   By: Van Clines M.D.   On: 04/25/2020 09:02     EKG: Independently reviewed.  Sinus rhythm, QTC 470, LAD, PVC, poor R wave progression, left bundle blockade which is old. Assessment/Plan Principal Problem:   Stroke [facility]) Active Problems:   Chronic diastolic heart failure (HCC)   Hypertension   Type II diabetes mellitus with renal manifestations (HCC)   Acute renal failure superimposed on stage 3a chronic kidney disease (HCC)   Hyperlipidemia   Essential hypertension   Pancreatic mass   Leukocytosis   Thrombocytopenia ([place])  Stroke [facility]): Dr. Doy Mince of neurology is consulted  -Placed on MedSurg bed for observation - will follow up Neurology's Recs.  - Obtain MRA  - will hold oral Bp meds to allow permissive HTN in the setting of acute stroke  - Check carotid dopplers  - ASA, Lipitor - fasting lipid panel and HbA1c  - 2D transthoracic echocardiography  - swallowing screen. If fails, will get SLP - PT/OT consult  Chronic diastolic heart failure ([place]): 2D echo on 07/30/2017 showed EF of 75%.  Patient has trace leg edema.  No respiratory distress.  BNP elevated at 560, but does not seem to have acute CHF exacerbation clinically. -Will not give diuretics in the setting of acute stroke and worsening renal  function  Hypertension - will hold oral Bp meds to allow permissive HTN in the setting of acute stroke  -IV hydralazine as needed for SBP> 180  Type II diabetes mellitus with renal manifestations ([place]): A1c 7.2, poorly controlled.  Patient is not taking medications.  She used to take 70/30 insulin.  Blood sugar 25 -Sliding scale insulin  Acute renal failure superimposed on stage 3a chronic kidney disease ([place]): Baseline Cre is 1.3-1.5, pt's Cre is 1.81, BUN 48 on admission. 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  No fever or other signs of infection.  Likely  reactive -f/u by CBC   Thrombocytopenia ([place]): Platelet 74.  Etiology is not clear. -Check LDH and peripheral smear -Follow-up of a CBC         DVT ppx: SCD Code Status: Full code per his son Family Communication:  Yes, patient's son   at bed side Disposition Plan:  Anticipate discharge back to previous environment Consults called: Dr. Doy Mince of neurology Admission status: Med-surg bed for obs   Status is: Observation  The patient remains OBS appropriate and will d/c before 2 midnights.  Dispo: The patient is from: Home              Anticipated d/c is to: Home              Anticipated d/c date is: 1 day              Patient currently is not medically stable to d/c.         Date of Service 04/25/2020    Ivor Costa [facility]   If 7PM-7AM, please contact night-coverage [email or web address] 04/25/2020, 5:59 PM

## 2020-04-25 NOTE — ED Notes (Signed)
Ct completed, patient off unit to MRI

## 2020-04-25 NOTE — ED Provider Notes (Signed)
ER Provider Note       Time seen: 11:25 AM    I have reviewed the vital signs and the nursing notes.  HISTORY   Chief Complaint Weakness    HPI Chelsea Davis is a 84 y.o. female with a history of chronic diastolic heart failure, CKD, diabetes, essential hypertension, hyperlipidemia, pancreatic cancer who presents today for acute left-sided weakness with facial droop and slurred speech.  She was last known well around 1 AM.  She had clear speech yesterday according to EMS.  She denies any pain.  Past Medical History:  Diagnosis Date  . Benign tumor of labia majora    a. resected/[facility].  . Chronic diastolic CHF (congestive heart failure) ([place])    a. 03/2013 Neg MV;  b. 10/2013 Echo: EF 65-70%, LVH, diast dysfxn, Triv MR/TR, mildly dil LA.  . CKD (chronic kidney disease), stage III    a. 06/2013 Renal Duplex: nl renal arteries.  . Diabetes mellitus without complication ([place])   . Essential hypertension   . Hyperlipidemia     Past Surgical History:  Procedure Laterality Date  . CYST EXCISION    . hysterectomy    . VULVA SURGERY      Allergies Iodinated diagnostic agents, Amlodipine, Coreg [carvedilol], Dye fdc red [red dye], Iodides, Metformin, and Spironolactone  Review of Systems Constitutional: Negative for fever. Cardiovascular: Negative for chest pain. Respiratory: Negative for shortness of breath. Gastrointestinal: Negative for abdominal pain, vomiting and diarrhea. Musculoskeletal: Negative for back pain. Skin: Negative for rash. Neurological: Positive for left-sided weakness and difficulty speaking  All systems negative/normal/unremarkable except as stated in the HPI  ____________________________________________   PHYSICAL EXAM:  VITAL SIGNS: Vitals:   04/25/20 1117  BP: (!) 157/59  Pulse: 61  Temp: 98.6 F (37 C)  SpO2: 99%    Constitutional: Alert, no distress Eyes: Conjunctivae are normal. Normal extraocular movements. ENT      Head:  Normocephalic and atraumatic.      Nose: No congestion/rhinnorhea.      Mouth/Throat: Mucous membranes are moist.      Neck: No stridor. Cardiovascular: Normal rate, regular rhythm. No murmurs, rubs, or gallops. Respiratory: Normal respiratory effort without tachypnea nor retractions. Breath sounds are clear and equal bilaterally. No wheezes/rales/rhonchi. Gastrointestinal: Soft and nontender. Normal bowel sounds Musculoskeletal: Nontender with normal range of motion in extremities. No lower extremity tenderness nor edema. Neurologic: Dysarthria is noted, left arm and left leg weakness compared to right, left-sided facial droop is noted Skin:  Skin is warm, dry and intact. No rash noted. Psychiatric: Speech and behavior are normal.  ____________________________________________  EKG: Interpreted by me.  Sinus rhythm with a rate of 61 bpm, PVCs, left bundle branch block, normal QT  ____________________________________________   LABS (pertinent positives/negatives)  Labs Reviewed  GLUCOSE, CAPILLARY - Abnormal; Notable for the following components:      Result Value   Glucose-Capillary 124 (*)    All other components within normal limits  COMPREHENSIVE METABOLIC PANEL - Abnormal; Notable for the following components:   Glucose, Bld 125 (*)    BUN 48 (*)    Creatinine, Ser 1.81 (*)    Calcium 8.5 (*)    Albumin 2.6 (*)    Total Bilirubin 2.1 (*)    GFR calc non Af Amer 26 (*)    GFR calc Af Amer 30 (*)    All other components within normal limits  CBC WITH DIFFERENTIAL/PLATELET - Abnormal; Notable for the following components:   WBC  12.6 (*)    RDW 24.0 (*)    Platelets 74 (*)    nRBC 0.5 (*)    Neutro Abs 9.6 (*)    Abs Immature Granulocytes 0.10 (*)    All other components within normal limits  URINALYSIS, ROUTINE W REFLEX MICROSCOPIC  PROTIME-INR  APTT  PATHOLOGIST SMEAR REVIEW    RADIOLOGY  Images were viewed by me CT head IMPRESSION: 1. Stable mild age related  cerebral atrophy, ventriculomegaly and periventricular white matter disease. 2. No acute intracranial findings or mass lesions.  PET scan IMPRESSION: 1. Unusual pattern of hypermetabolic adenopathy in the abdomen and pelvis along with a large centrally necrotic or cystic mass in the pancreatic tail with hypermetabolic margins. 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 High suspicion for malignancy but this would be an unusual pattern for typical pancreatic adenocarcinoma. There is also focal hypermetabolic activity in a region of soft tissue prominence in the rectum which could be a rectal adenocarcinoma. Differential diagnostic possibilities include metastatic rectal cancer, an unusual spread of pancreatic malignancy (with the rectal activity being spurious/physiologic), or lymphoma with pancreatic and possibly rectal involvement. If not recently performed, colonoscopy or at least sigmoidoscopy would be recommended to assess the potential mid rectal mass. Assuming that standard pancreatic protocol MRI with and without contrast cannot be performed for further characterization of the pancreatic mass, tissue diagnosis from the pancreas may be warranted. 2. There is also a hypermetabolic right axillary lymph node concerning for metastatic spread. Equivocal soft tissue density along the left axillary neurovascular structures without hypermetabolic activity. 3. Left thyroid nodule similar to appearance on 01/01/2018. This has been evaluated on previous imaging, at the time of prior characterization on 01/01/2018 a thyroid ultrasound was recommended, correlate with patient history in determining whether this was performed. (Ref: J Am Coll Radiol. 2015 Feb;12(2): 143-50). 4. Other imaging findings of potential clinical significance: Aortic Atherosclerosis (ICD10-I70.0). Coronary atherosclerosis with mild cardiomegaly. Trace left pleural effusion  with trace pericardial effusion. Small amount of ascites.   DIFFERENTIAL DIAGNOSIS  CVA, TIA, brain tumor, hemorrhage, occult infection  ASSESSMENT AND PLAN  CVA   Plan: The patient had presented for acute left-sided weakness. Patient's labs revealed mild elevations in BUN and creatinine compared to prior for which she was given IV fluids.  Unfortunately PET scan from yesterday revealed worsening malignancy.  This is likely the cause of her hypercoagulable state and likely resulting CVA.  Clinically her left side appears stronger now but she still has pronounced dysarthria.  We will perform a stroke swallow screen and she will get a full dose aspirin.  Lenise Arena MD    Note: This note was generated in part or whole with voice recognition software. Voice recognition is usually quite accurate but there are transcription errors that can and very often do occur. I apologize for any typographical errors that were not detected and corrected.     Earleen Newport, MD 04/25/20 1239

## 2020-04-25 NOTE — ED Notes (Signed)
Unable to draw blue top or start a second line. IV consult placed.

## 2020-04-25 NOTE — ED Notes (Signed)
Son at bedside. Patient awaiting MRI

## 2020-04-25 NOTE — ED Notes (Signed)
Patient off unit for second MRI

## 2020-04-25 NOTE — ED Notes (Signed)
Report given to receiving nurse. Transport called.

## 2020-04-25 NOTE — Consult Note (Signed)
Requesting Physician: Blaine Hamper    Chief Complaint: Left sided weakness and left facial droop  I have been asked by Dr. Blaine Hamper to see this patient in consultation for acute infarct.  HPI: Chelsea Davis is an 84 y.o. female with medical history significant of hypertension, hyperlipidemia, diabetes mellitus, CKD-3, dCHF, hypothyroidism, malignant neoplasm of vulva (s/p of surgery in [facility]), pancreatic mass, who presents with left-sided weakness and slurred speech.  Son left the patient at 0100 and she was at baseline.  When he returned later in the morning he noted that she had slurred speech.  She seemed to progress to facial droop and left sided weakness.  Patient was brought in for further evaluation.  Patient on no antiplatelet therapy prior to admission.   At baseline patient requires assistance for ambulation, dressing and using the bathroom.  She is able to feed herself.    Date last known well: 04/25/2020 Time last known well: Time: 01:00 tPA Given: No: Outside time window  Past Medical History:  Diagnosis Date  . Benign tumor of labia majora    a. resected/[facility].  . Chronic diastolic CHF (congestive heart failure) ([place])    a. 03/2013 Neg MV;  b. 10/2013 Echo: EF 65-70%, LVH, diast dysfxn, Triv MR/TR, mildly dil LA.  . CKD (chronic kidney disease), stage III    a. 06/2013 Renal Duplex: nl renal arteries.  . Diabetes mellitus without complication ([place])   . Essential hypertension   . Hyperlipidemia     Past Surgical History:  Procedure Laterality Date  . CYST EXCISION    . hysterectomy    . VULVA SURGERY      Family History  Problem Relation Age of Onset  . Hypertension Mother   . Hypertension Father   . Colon cancer Father   . Stomach cancer Sister    Social History:  reports that she has never smoked. She has never used smokeless tobacco. She reports that she does not drink alcohol and does not use drugs.  Allergies:  Allergies  Allergen Reactions  . Iodinated Diagnostic Agents  Hives, Rash and Swelling  . Amlodipine     Other reaction(s): Unknown edema  . Coreg [Carvedilol]     Bradycardia   . Dye Fdc Red [Red Dye]   . Iodides Hives, Rash and Swelling  . Metformin Diarrhea  . Spironolactone Rash    Medications:  I have reviewed the patient's current medications. Prior to Admission:  Medications Prior to Admission  Medication Sig Dispense Refill Last Dose  . albuterol (PROAIR HFA) 108 (90 Base) MCG/ACT inhaler Inhale 2 puffs into the lungs every 6 (six) hours as needed for wheezing or shortness of breath.    Unknown at PRN  . amLODipine (NORVASC) 5 MG tablet Take 5 mg by mouth daily.    7+ days at Unknown  . atorvastatin (LIPITOR) 20 MG tablet Take 20 mg by mouth daily.    7+ days at Unknown  . lipase/protease/amylase (CREON) 36000 UNITS CPEP capsule Take 1 capsule (36,000 Units total) by mouth 3 (three) times daily with meals. May also take 1 capsule (36,000 Units total) as needed (with snacks). 21 capsule 0   . losartan (COZAAR) 100 MG tablet Take 100 mg by mouth daily.    7+ days at Unknown   Scheduled: . aspirin  300 mg Rectal Daily   Or  . aspirin EC  325 mg Oral Daily  . atorvastatin  20 mg Oral Daily  . insulin aspart  0-5  Units Subcutaneous QHS  . insulin aspart  0-9 Units Subcutaneous TID WC    ROS: Patient unable to provide  Physical Examination: Blood pressure (!) 155/51, pulse (!) 58, temperature 97.7 F (36.5 C), temperature source Oral, resp. rate 20, height 4\' 8"  (1.422 m), weight 47.6 kg, SpO2 100 %.  HEENT-  Normocephalic, no lesions, without obvious abnormality.  Normal external eye and conjunctiva.  Normal TM's bilaterally.  Normal auditory canals and external ears. Normal external nose, mucus membranes and septum.  Normal pharynx. Cardiovascular- S1, S2 normal, pulses palpable throughout   Lungs- chest clear, no wheezing, rales, normal symmetric air entry Abdomen- soft, non-tender; bowel sounds normal; no masses,  no  organomegaly Extremities- LE edema Lymph-no adenopathy palpable Musculoskeletal-no joint tenderness, deformity or swelling Skin-warm and dry, no hyperpigmentation, vitiligo, or suspicious lesions  Neurological Examination   Mental Status: Alert.  Markedly dysarthric but does not appear aphasic.  Able to follow commands without difficulty. Cranial Nerves: II: Visual fields grossly normal, pupils equal, round, reactive to light and accommodation III,IV, VI: ptosis not present, extra-ocular motions intact bilaterally V,VII: left facial droop, facial light touch sensation normal bilaterally VIII: hearing normal bilaterally IX,X: gag reflex present XI: bilateral shoulder shrug XII: midline tongue extension Motor: Able to lift all extremities against gravity with drift noted of the LUE and LLE Sensory: Pinprick and light touch intact throughout, bilaterally Deep Tendon Reflexes: Symmetric throughout Plantars: Right: mute   Left: mute Cerebellar: Unable to perform due to weakness Gait: not tested due to safety concerns  Laboratory Studies:  Basic Metabolic Panel: Recent Labs  Lab 04/19/20 1603 04/25/20 1126  NA 140 141  K 3.8 4.7  CL 105 104  CO2 21* 22  GLUCOSE 153* 125*  BUN 38* 48*  CREATININE 1.62* 1.81*  CALCIUM 8.3* 8.5*    Liver Function Tests: Recent Labs  Lab 04/19/20 1603 04/25/20 1126  AST 17 23  ALT 19 21  ALKPHOS 70 63  BILITOT 1.6* 2.1*  PROT 7.4 7.0  ALBUMIN 2.8* 2.6*   No results for input(s): LIPASE, AMYLASE in the last 168 hours. No results for input(s): AMMONIA in the last 168 hours.  CBC: Recent Labs  Lab 04/19/20 1603 04/25/20 1126  WBC 11.7* 12.6*  NEUTROABS 9.8* 9.6*  HGB 13.2 13.5  HCT 39.5 41.3  MCV 89.2 94.3  PLT 81* 74*    Cardiac Enzymes: No results for input(s): CKTOTAL, CKMB, CKMBINDEX, TROPONINI in the last 168 hours.  BNP: Invalid input(s): POCBNP  CBG: Recent Labs  Lab 04/24/20 1243 04/25/20 1116 04/25/20 1711  04/25/20 2121  GLUCAP 130* 124* 105* 108*    Microbiology: Results for orders placed or performed during the hospital encounter of 04/25/20  SARS Coronavirus 2 by RT PCR (hospital order, performed in [facility] hospital lab) Nasopharyngeal Nasopharyngeal Swab     Status: None   Collection Time: 04/25/20 11:26 AM   Specimen: Nasopharyngeal Swab  Result Value Ref Range Status   SARS Coronavirus 2 NEGATIVE NEGATIVE Final    Comment: (NOTE) SARS-CoV-2 target nucleic acids are NOT DETECTED.  The SARS-CoV-2 RNA is generally detectable in upper and lower respiratory specimens during the acute phase of infection. The lowest concentration of SARS-CoV-2 viral copies this assay can detect is 250 copies / mL. A negative result does not preclude SARS-CoV-2 infection and should not be used as the sole basis for treatment or other patient management decisions.  A negative result may occur with improper specimen collection / handling, submission  of specimen other than nasopharyngeal swab, presence of viral mutation(s) within the areas targeted by this assay, and inadequate number of viral copies (<250 copies / mL). A negative result must be combined with clinical observations, patient history, and epidemiological information.  Fact Sheet for Patients:   StrictlyIdeas.no  Fact Sheet for Healthcare Providers: BankingDealers.co.za  This test is not yet approved or  cleared by the [place] FDA and has been authorized for detection and/or diagnosis of SARS-CoV-2 by FDA under an Emergency Use Authorization (EUA).  This EUA will remain in effect (meaning this test can be used) for the duration of the COVID-19 declaration under Section 564(b)(1) of the Act, 21 U.S.C. section 360bbb-3(b)(1), unless the authorization is terminated or revoked sooner.  Performed at [facility], [address] [place], [place] [address]     Coagulation  Studies: Recent Labs    04/25/20 1500  LABPROT 17.1*  INR 1.5*    Urinalysis: No results for input(s): COLORURINE, LABSPEC, PHURINE, GLUCOSEU, HGBUR, BILIRUBINUR, KETONESUR, PROTEINUR, UROBILINOGEN, NITRITE, LEUKOCYTESUR in the last 168 hours.  Invalid input(s): APPERANCEUR  Lipid Panel:    Component Value Date/Time   CHOL 138 03/25/2013 1320   TRIG 85 03/25/2013 1320   HDL 51 03/25/2013 1320   VLDL 17 03/25/2013 1320   LDLCALC 70 03/25/2013 1320    HgbA1C:  Lab Results  Component Value Date   HGBA1C 7.2 (H) 03/25/2013    Urine Drug Screen:  No results found for: LABOPIA, COCAINSCRNUR, LABBENZ, AMPHETMU, THCU, LABBARB  Alcohol Level: No results for input(s): ETH in the last 168 hours.  Other results: EKG: sinus rhythm at 61 bpm.  Imaging: CT HEAD WO CONTRAST  Result Date: 04/25/2020 CLINICAL DATA:  Left-sided facial droop and slurred speech. EXAM: CT HEAD WITHOUT CONTRAST TECHNIQUE: Contiguous axial images were obtained from the base of the skull through the vertex without intravenous contrast. COMPARISON:  CT scan 01/01/2018 FINDINGS: Brain: Stable mild age related cerebral atrophy, ventriculomegaly and periventricular white matter disease. Stable bilateral basal ganglia calcifications. No extra-axial fluid collections are identified. No CT findings for acute hemispheric infarction or intracranial hemorrhage. No mass lesions. The brainstem and cerebellum are normal. Vascular: Stable vascular calcifications. No aneurysm or hyperdense vessels. Skull: No skull fracture or bone lesions. Sinuses/Orbits: The paranasal sinuses and mastoid air cells are clear. The globes are intact. Other: No scalp lesions or hematoma. IMPRESSION: 1. Stable mild age related cerebral atrophy, ventriculomegaly and periventricular white matter disease. 2. No acute intracranial findings or mass lesions. Electronically Signed   By: Marijo Sanes M.D.   On: 04/25/2020 12:20   MR ANGIO HEAD WO  CONTRAST  Result Date: 04/25/2020 CLINICAL DATA:  Stroke EXAM: MRA HEAD WITHOUT CONTRAST TECHNIQUE: Angiographic images of the Circle of Willis were obtained using MRA technique without intravenous contrast. COMPARISON:  MRI head 04/25/2020 FINDINGS: Both vertebral arteries patent to the basilar. Mild stenosis distal left vertebral artery. Right PICA patent. Left PICA not visualized. Basilar widely patent. Superior cerebellar and posterior cerebral arteries patent bilaterally. No significant stenosis. Fetal origin left posterior cerebral artery. Irregular narrowing of the internal carotid artery at the skull base on the left may be due to atherosclerotic disease or artifact. Right internal carotid artery widely patent. Cavernous carotid patent bilaterally. Anterior and middle cerebral arteries patent bilaterally. Irregularity of MCA branches bilaterally likely due to atherosclerotic disease. Mild irregularity in the anterior cerebral arteries. IMPRESSION: Negative for large vessel occlusion Atherosclerotic irregularity in the anterior and middle cerebral arteries  bilaterally. Electronically Signed   By: Franchot Gallo M.D.   On: 04/25/2020 17:55   MR BRAIN WO CONTRAST  Result Date: 04/25/2020 CLINICAL DATA:  Focal neurological deficit EXAM: MRI HEAD WITHOUT CONTRAST TECHNIQUE: Multiplanar, multiecho pulse sequences of the brain and surrounding structures were obtained without intravenous contrast. COMPARISON:  Prior same day head CT. FINDINGS: Brain: Multifocal scattered bilateral cerebral and cerebellar foci of restricted diffusion involving the left caudate, bilateral frontoparietal and occipital regions. The largest acute infarct involves the right corona radiata (5:34). There are also acute infarcts involving the right hippocampal tail and possibly dorsal left temporal region. Mild cerebral atrophy with ex vacuo dilatation. Background scattered T2/FLAIR hyperintense foci involving the periventricular white  matter are nonspecific however commonly associated with chronic microvascular ischemic changes. No midline shift, mass lesion or extra-axial fluid collection. No intracranial hemorrhage. Vascular: Grossly normal flow voids. Skull and upper cervical spine: Normal marrow signal. Sinuses/Orbits: Normal orbits. Clear paranasal sinuses. No mastoid effusion. Other: Partially imaged left cheek cystic lesion. IMPRESSION: Multifocal cerebral and cerebellar acute infarcts, likely embolic with involvement of the left caudate and right hippocampal tail. The largest infarct involves the right corona radiata. Mild cerebral atrophy. Mild background chronic microvascular ischemic changes. Indeterminate left cheek cystic lesion. Correlation with patient history and physical exam is recommended. Consider outpatient dermatology consult. These results were called by telephone at the time of interpretation on 04/25/2020 at 2:31 pm to provider Lenise Arena , who verbally acknowledged these results. Electronically Signed   By: Primitivo Gauze M.D.   On: 04/25/2020 14:36   NM PET Image Initial (PI) Skull Base To Thigh  Result Date: 04/25/2020 CLINICAL DATA:  Initial treatment strategy for pancreatic tail mass. Remote history of vulvar squamous cell carcinoma. EXAM: NUCLEAR MEDICINE PET SKULL BASE TO THIGH TECHNIQUE: 8.3 mCi F-18 FDG was injected intravenously. Full-ring PET imaging was performed from the skull base to thigh after the radiotracer. CT data was obtained and used for attenuation correction and anatomic localization. Fasting blood glucose: 130 mg/dl COMPARISON:  04/13/2020 CT scan FINDINGS: Mediastinal blood pool activity: SUV max 3.8 Liver activity: SUV max 4.6 NECK: 3.0 by 2.9 cm solid left thyroid nodule has similar activity to the rest of thyroid is not hypermetabolic. This was previously shown on the CT cervical spine from 01/01/2018 and thyroid ultrasound was recommended at that time. This has been evaluated  on previous imaging, although correlation with patient history is recommended to ensure that the recommended thyroid workup was performed. (Ref: J Am Coll Radiol. 2015 Feb;12(2): 143-50). Incidental CT findings: Small subcutaneous cystic lesion along the left face in similar lesions along the left upper back near the base of the neck, probably sebaceous cysts or similar benign lesions. CHEST: Right axillary lymph node 0.8 cm in short axis on image 75/4 with maximum SUV 6.0. Fullness of density along the left axillary neurovascular structures probably reflecting an adjacent lymph node, maximum SUV is only 2.7. Incidental CT findings: Coronary, aortic arch, and branch vessel atherosclerotic vascular disease. Mild cardiomegaly. Trace left pleural effusion and trace pericardial effusion. ABDOMEN/PELVIS: Abnormal expansion of the pancreatic tail corresponding to a mass with central necrosis and hypermetabolic rim, versus a mass associated with marked dilatation of the dorsal pancreatic duct. The abnormal hypermetabolic lesion has a maximum SUV of 10.0 and measures approximately 9.1 by 5.0 by 5.6 cm There is some accentuated signal tracking in the portal vein and superior mesenteric vein suspicious for tumor thrombus, maximum SUV 5.6 which is substantially  higher than the normal blood pool. Small but hypermetabolic retrocrural, periaortic, common iliac, internal iliac, and external iliac adenopathy. Index retrocaval node measuring 0.8 cm in short axis on image 148/4, maximum SUV 8.7. Index left external iliac node 1.0 cm in short axis on image 200/4, maximum SUV 13.0. Index right external iliac node 1.2 cm in short axis on image 206/4, maximum SUV 12.0. Scattered accentuated metabolic activity in loops of bowel. The only region of moderate suspicion is the focal circumferential activity in the rectum corresponding to potential soft tissue prominence in the rectum in the vicinity of image 210/4, maximum SUV 19.6 Incidental  CT findings: Aortoiliac atherosclerotic vascular disease. Mild ascites most notable in the perihepatic region. SKELETON: No significant abnormal hypermetabolic activity in this region. Incidental CT findings: none IMPRESSION: 1. Unusual pattern of hypermetabolic adenopathy in the abdomen and pelvis along with a large centrally necrotic or cystic mass in the pancreatic tail with hypermetabolic margins. 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 High suspicion for malignancy but this would be an unusual pattern for typical pancreatic adenocarcinoma. There is also focal hypermetabolic activity in a region of soft tissue prominence in the rectum which could be a rectal adenocarcinoma. Differential diagnostic possibilities include metastatic rectal cancer, an unusual spread of pancreatic malignancy (with the rectal activity being spurious/physiologic), or lymphoma with pancreatic and possibly rectal involvement. If not recently performed, colonoscopy or at least sigmoidoscopy would be recommended to assess the potential mid rectal mass. Assuming that standard pancreatic protocol MRI with and without contrast cannot be performed for further characterization of the pancreatic mass, tissue diagnosis from the pancreas may be warranted. 2. There is also a hypermetabolic right axillary lymph node concerning for metastatic spread. Equivocal soft tissue density along the left axillary neurovascular structures without hypermetabolic activity. 3. Left thyroid nodule similar to appearance on 01/01/2018. This has been evaluated on previous imaging, at the time of prior characterization on 01/01/2018 a thyroid ultrasound was recommended, correlate with patient history in determining whether this was performed. (Ref: J Am Coll Radiol. 2015 Feb;12(2): 143-50). 4. Other imaging findings of potential clinical significance: Aortic Atherosclerosis (ICD10-I70.0). Coronary atherosclerosis with mild  cardiomegaly. Trace left pleural effusion with trace pericardial effusion. Small amount of ascites. Electronically Signed   By: Van Clines M.D.   On: 04/25/2020 09:02    Assessment: 84 y.o. female with medical history significant of hypertension, hyperlipidemia, diabetes mellitus, CKD-3, dCHF, hypothyroidism, malignant neoplasm of vulva (s/p of surgery in [facility]), pancreatic mass, who presents with left-sided weakness and slurred speech.  MRI of the brain personally reviewed and shows multifocal acute infarcts in multiple vascular distributions.  Embolic etiology likely.  Patient with recent diagnosis of cancer.  Can not rule out a hypercoagulable syndrome related to the same. MRA of the brain shows atherosclerotic irregularity in the anterior and middle cerebral arteries bilaterally but no evidence of LVO.  Echocardiogram and carotid dopplers are pending.  A1c and lipid panel are pending.     Stroke Risk Factors - diabetes mellitus, hyperlipidemia and hypertension  Plan: 1. HgbA1c, fasting lipid panel pending 2. PT consult, OT consult, Speech consult 3. Echocardiogram pending 4. Carotid dopplers pending 5. Prophylactic therapy-Antiplatelet med: Aspirin - dose 81 mg daily.  Will need to follow platelet count and discontinue if it continues to fall. Continue statin 6. NPO until RN stroke swallow screen 7. Telemetry monitoring 8. Frequent neuro checks  Alexis Goodell, MD Neurology [phone] 04/25/2020, 10:34 PM

## 2020-04-25 NOTE — ED Notes (Signed)
Blood glucose 124

## 2020-04-25 NOTE — ED Triage Notes (Signed)
Patient from home with c/o of weakness left side facial droop and slur speech. Last well known time 0100, this morning, patient with clear speech yesterday as per ems.

## 2020-04-26 ENCOUNTER — Observation Stay (HOSPITAL_COMMUNITY)
Admit: 2020-04-26 | Discharge: 2020-04-26 | Disposition: A | Discharge status: Home / Self Care | Payer: Medicare Other | Attending: Internal Medicine | Admitting: Internal Medicine

## 2020-04-26 ENCOUNTER — Observation Stay: Payer: Medicare Other

## 2020-04-26 ENCOUNTER — Observation Stay: Admit: 2020-04-26 | Payer: Medicare Other

## 2020-04-26 DIAGNOSIS — Z7189 Other specified counseling: Secondary | ICD-10-CM | POA: Diagnosis not present

## 2020-04-26 DIAGNOSIS — Z66 Do not resuscitate: Secondary | ICD-10-CM | POA: Diagnosis present

## 2020-04-26 DIAGNOSIS — Z20822 Contact with and (suspected) exposure to covid-19: Secondary | ICD-10-CM | POA: Diagnosis present

## 2020-04-26 DIAGNOSIS — E11649 Type 2 diabetes mellitus with hypoglycemia without coma: Secondary | ICD-10-CM | POA: Diagnosis present

## 2020-04-26 DIAGNOSIS — I10 Essential (primary) hypertension: Secondary | ICD-10-CM

## 2020-04-26 DIAGNOSIS — Z515 Encounter for palliative care: Secondary | ICD-10-CM | POA: Diagnosis present

## 2020-04-26 DIAGNOSIS — N1831 Chronic kidney disease, stage 3a: Secondary | ICD-10-CM

## 2020-04-26 DIAGNOSIS — I639 Cerebral infarction, unspecified: Secondary | ICD-10-CM | POA: Diagnosis present

## 2020-04-26 DIAGNOSIS — D696 Thrombocytopenia, unspecified: Secondary | ICD-10-CM

## 2020-04-26 DIAGNOSIS — Z800 Family history of malignant neoplasm of digestive organs: Secondary | ICD-10-CM | POA: Diagnosis not present

## 2020-04-26 DIAGNOSIS — K8689 Other specified diseases of pancreas: Secondary | ICD-10-CM

## 2020-04-26 DIAGNOSIS — G8194 Hemiplegia, unspecified affecting left nondominant side: Secondary | ICD-10-CM | POA: Diagnosis present

## 2020-04-26 DIAGNOSIS — E782 Mixed hyperlipidemia: Secondary | ICD-10-CM

## 2020-04-26 DIAGNOSIS — I5032 Chronic diastolic (congestive) heart failure: Secondary | ICD-10-CM | POA: Diagnosis present

## 2020-04-26 DIAGNOSIS — Z79899 Other long term (current) drug therapy: Secondary | ICD-10-CM | POA: Diagnosis not present

## 2020-04-26 DIAGNOSIS — E785 Hyperlipidemia, unspecified: Secondary | ICD-10-CM | POA: Diagnosis present

## 2020-04-26 DIAGNOSIS — D6859 Other primary thrombophilia: Secondary | ICD-10-CM | POA: Diagnosis present

## 2020-04-26 DIAGNOSIS — Z8249 Family history of ischemic heart disease and other diseases of the circulatory system: Secondary | ICD-10-CM | POA: Diagnosis not present

## 2020-04-26 DIAGNOSIS — N179 Acute kidney failure, unspecified: Secondary | ICD-10-CM

## 2020-04-26 DIAGNOSIS — I130 Hypertensive heart and chronic kidney disease with heart failure and stage 1 through stage 4 chronic kidney disease, or unspecified chronic kidney disease: Secondary | ICD-10-CM | POA: Diagnosis present

## 2020-04-26 DIAGNOSIS — I422 Other hypertrophic cardiomyopathy: Secondary | ICD-10-CM | POA: Diagnosis present

## 2020-04-26 DIAGNOSIS — I6389 Other cerebral infarction: Secondary | ICD-10-CM | POA: Diagnosis not present

## 2020-04-26 DIAGNOSIS — I63443 Cerebral infarction due to embolism of bilateral cerebellar arteries: Secondary | ICD-10-CM | POA: Diagnosis present

## 2020-04-26 DIAGNOSIS — E1122 Type 2 diabetes mellitus with diabetic chronic kidney disease: Secondary | ICD-10-CM | POA: Diagnosis present

## 2020-04-26 DIAGNOSIS — E1121 Type 2 diabetes mellitus with diabetic nephropathy: Secondary | ICD-10-CM

## 2020-04-26 DIAGNOSIS — D72829 Elevated white blood cell count, unspecified: Secondary | ICD-10-CM

## 2020-04-26 DIAGNOSIS — R29722 NIHSS score 22: Secondary | ICD-10-CM | POA: Diagnosis not present

## 2020-04-26 DIAGNOSIS — R29707 NIHSS score 7: Secondary | ICD-10-CM | POA: Diagnosis present

## 2020-04-26 LAB — LIPID PANEL
Cholesterol: 194 mg/dL (ref 0–200)
HDL: 18 mg/dL — ABNORMAL LOW (ref >40)
LDL Cholesterol: 135 mg/dL — ABNORMAL HIGH (ref 0–99)
Total CHOL/HDL Ratio: 10.8 RATIO
Triglycerides: 206 mg/dL — ABNORMAL HIGH (ref <150)
VLDL: 41 mg/dL — ABNORMAL HIGH (ref 0–40)

## 2020-04-26 LAB — ECHOCARDIOGRAM COMPLETE
AR max vel: 2.26 cm2
AV Area VTI: 2.06 cm2
AV Area mean vel: 2.52 cm2
AV Mean grad: 5 mmHg
AV Peak grad: 11.3 mmHg
Ao pk vel: 1.68 m/s
Area-P 1/2: 2.11 cm2
Height: 56 in
S' Lateral: 1.62 cm
Weight: 1680 oz

## 2020-04-26 LAB — GLUCOSE, CAPILLARY: Glucose-Capillary: 95 mg/dL (ref 70–99)

## 2020-04-26 LAB — HEMOGLOBIN A1C
Hgb A1c MFr Bld: 5.2 % (ref 4.8–5.6)
Mean Plasma Glucose: 102.54 mg/dL

## 2020-04-26 MED ORDER — ASPIRIN EC 81 MG PO TBEC: 81.0000 mg | DELAYED_RELEASE_TABLET | Freq: Every day | ORAL | Status: DC

## 2020-04-26 MED ORDER — BIOTENE DRY MOUTH MT LIQD: 15.0000 mL | OROMUCOSAL | Status: DC | PRN

## 2020-04-26 MED ORDER — HALOPERIDOL LACTATE 5 MG/ML IJ SOLN: 0.5000 mg | INTRAMUSCULAR | Status: DC | PRN

## 2020-04-26 MED ORDER — OXYCODONE HCL 20 MG/ML PO CONC
2.5000 mg | ORAL | Status: DC | PRN
  Filled 2020-04-26: qty 1

## 2020-04-26 MED ORDER — ONDANSETRON 4 MG PO TBDP
4.0000 mg | ORAL_TABLET | Freq: Four times a day (QID) | ORAL | Status: DC | PRN
  Filled 2020-04-26: qty 1

## 2020-04-26 MED ORDER — GLYCOPYRROLATE 0.2 MG/ML IJ SOLN: 0.2000 mg | INTRAMUSCULAR | Status: DC | PRN

## 2020-04-26 MED ORDER — ACETAMINOPHEN 650 MG RE SUPP: 650.0000 mg | Freq: Four times a day (QID) | RECTAL | Status: DC | PRN

## 2020-04-26 MED ORDER — POLYVINYL ALCOHOL 1.4 % OP SOLN
1.0000 [drp] | Freq: Four times a day (QID) | OPHTHALMIC | Status: DC | PRN
  Filled 2020-04-26: qty 15

## 2020-04-26 MED ORDER — HALOPERIDOL LACTATE 2 MG/ML PO CONC
0.5000 mg | ORAL | Status: DC | PRN
  Filled 2020-04-26: qty 0.3

## 2020-04-26 MED ORDER — ATORVASTATIN CALCIUM 20 MG PO TABS: 40.0000 mg | ORAL_TABLET | Freq: Every evening | ORAL | Status: DC

## 2020-04-26 MED ORDER — OXYCODONE HCL 20 MG/ML PO CONC: 2.5000 mg | ORAL | Status: DC | PRN

## 2020-04-26 MED ORDER — LORAZEPAM 2 MG/ML IJ SOLN: 0.5000 mg | INTRAMUSCULAR | Status: DC | PRN

## 2020-04-26 MED ORDER — ACETAMINOPHEN 325 MG PO TABS
650.0000 mg | ORAL_TABLET | Freq: Four times a day (QID) | ORAL | Status: DC | PRN
Start: 2020-04-26 — End: 2020-04-26

## 2020-04-26 MED ORDER — SODIUM CHLORIDE 0.9% FLUSH
3.0000 mL | Freq: Two times a day (BID) | INTRAVENOUS | Status: DC
  Administered 2020-04-26: 22:00:00 3 mL via INTRAVENOUS

## 2020-04-26 MED ORDER — MORPHINE SULFATE (PF) 2 MG/ML IV SOLN: 2.0000 mg | INTRAVENOUS | Status: DC | PRN

## 2020-04-26 MED ORDER — LORAZEPAM 1 MG PO TABS: 1.0000 mg | ORAL_TABLET | ORAL | Status: DC | PRN

## 2020-04-26 MED ORDER — LORAZEPAM 2 MG/ML PO CONC: 1.0000 mg | ORAL | Status: DC | PRN

## 2020-04-26 MED ORDER — GLYCOPYRROLATE 1 MG PO TABS
1.0000 mg | ORAL_TABLET | ORAL | Status: DC | PRN
  Filled 2020-04-26: qty 1

## 2020-04-26 MED ORDER — ONDANSETRON HCL 4 MG/2ML IJ SOLN: 4.0000 mg | Freq: Four times a day (QID) | INTRAMUSCULAR | Status: DC | PRN

## 2020-04-26 MED ORDER — SODIUM CHLORIDE 0.9% FLUSH: 3.0000 mL | INTRAVENOUS | Status: DC | PRN

## 2020-04-26 MED ORDER — HALOPERIDOL 0.5 MG PO TABS
0.5000 mg | ORAL_TABLET | ORAL | Status: DC | PRN
  Filled 2020-04-26: qty 1

## 2020-04-26 NOTE — Progress Notes (Signed)
*  PRELIMINARY RESULTS* Echocardiogram 2D Echocardiogram has been performed.  Chelsea Davis 04/26/2020, 11:48 AM

## 2020-04-26 NOTE — Progress Notes (Signed)
PT Cancellation Note  Patient Details Name: JOZLYN SCHATZ MRN: [id number] DOB: 1935-12-14   Cancelled Treatment:    Reason Eval/Treat Not Completed: Patient at procedure or test/unavailable (Consult received and chart reviewed. Patient currently off unit for diagnostic testing.  Will re-attempt at later time/date as patient medically appropriate and available.)   Harini Dearmond H. Owens Shark, PT, DPT, NCS 04/26/20, 8:54 AM [phone]

## 2020-04-26 NOTE — Progress Notes (Addendum)
[facility] Liaison note:  New referral for [facility] hospice at home received from Palliative NP Asencion Gowda, [facility] Doran Clay made aware. Patient information given to referral. Hospice eligibility confirmed.  Writer met in the room with patient's daughters, son and husband to initiate education regarding hospice services, philosophy and team approach to care with understanding voiced. DME needs discussed, hospital bed to be ordered for delivery tomorrow with discharge via EMS to follow. Patient will need a signed out of facility DNR in place for transport.   Will continue to follow through discharge. Thank you for the opportunity to be involved in the care of this patient and her family. Flo Shanks BSN, RN, [place] [phone].

## 2020-04-26 NOTE — Evaluation (Signed)
Clinical/Bedside Swallow Evaluation Patient Details  Name: DESHANTA LADY MRN: [id number] Date of Birth: May 01, 1936  Today's Date: 04/26/2020 Time: SLP Start Time (ACUTE ONLY): 1205 SLP Stop Time (ACUTE ONLY): 1234 SLP Time Calculation (min) (ACUTE ONLY): 29 min  Past Medical History:  Past Medical History:  Diagnosis Date  . Benign tumor of labia majora    a. resected/[facility].  . Chronic diastolic CHF (congestive heart failure) ([place])    a. 03/2013 Neg MV;  b. 10/2013 Echo: EF 65-70%, LVH, diast dysfxn, Triv MR/TR, mildly dil LA.  . CKD (chronic kidney disease), stage III    a. 06/2013 Renal Duplex: nl renal arteries.  . Diabetes mellitus without complication ([place])   . Essential hypertension   . Hyperlipidemia    Past Surgical History:  Past Surgical History:  Procedure Laterality Date  . CYST EXCISION    . hysterectomy    . VULVA SURGERY     HPI:  84 y.o. female who presented to ER secondary to acute onset of L-sided weakness, slurred speech; admitted for TIA/CVA work up. MRI significant for multifocal cerebral and cerebellar infarcts (largest in L caudate, R hippocampal region and L corona radiata), likely embolic in nature. Past medical history includes hypertension, hyperlipidemia, diabetes mellitus, CKD-3, dCHF, hypothyroidism, malignant neoplasm of vulva (s/p surgery at [facility]), pancreatic mass. Family reports recent decline in overall health.    Assessment / Plan / Recommendation Clinical Impression  Pt is at very high risk of aspiration with PO intake d/t severe oropharyngeal dysphagia. Pt's oral phase is severely impaired d/t left facial weakness which results in left anterior labial spillage, oral residue within the left lateral sulci as well as lengthy oral prep. Trials were presented via straw to pt's right as she had great difficulty establishing intra-oral pressure with straw to left. Pt was resistant to placing on her right. Pt's pharyngeal phase is c/b by immediate coughing with  consecutive sips of thin liquids and bites of applesauce, suspect related to decreased oral containment of bolus and oral residue. Pt's cough is very weak. When consuming single sips of water via straw, pt was symptom free, however silent aspiration cannot be ruled out at bedside. At this time, pt appears appropriate for single sips of thin water via straw but is not appropriate for any food textures. Pt's son and husband were present and despite being very thirsty, pt required extensive cues from [place] to consume more than ~ 8 sips of water and 2 bites of applesauce. They report that pt ate ~ 3 bites of cheese toast for breakfast and sipped on water to "wet her mouth" throughout the day (~ 2 oz during day). Education provided on dysphagia, aspiration risk, diet recommendation and risk of dehydration/malnutrition. All questions answered to their satisfaction. Recommend Hospice support. ST to sign off at this time. Please re-consult should pt become more appropriate for skilled intervention.  SLP Visit Diagnosis: Dysphagia, oropharyngeal phase (R13.12)    Aspiration Risk  Severe aspiration risk;Risk for inadequate nutrition/hydration    Diet Recommendation   NPO - single sips of thin water via straw only  Medication Administration: Via alternative means    Other  Recommendations Oral Care Recommendations: Oral care QID Other Recommendations: Prohibited food (jello, ice cream, thin soups);Remove water pitcher   Follow up Recommendations  (Hospice)      Frequency and Duration N/A         Prognosis Prognosis for Safe Diet Advancement:  (Poor) Barriers to Reach Goals: Severity of deficits;Behavior;Motivation  Swallow Study   General Date of Onset: 04/25/20 HPI: 84 y.o. female who presented to ER secondary to acute onset of L-sided weakness, slurred speech; admitted for TIA/CVA work up. MRI significant for multifocal cerebral and cerebellar infarcts (largest in L caudate, R hippocampal region  and L corona radiata), likely embolic in nature. Past medical history includes hypertension, hyperlipidemia, diabetes mellitus, CKD-3, dCHF, hypothyroidism, malignant neoplasm of vulva (s/p surgery at [facility]), pancreatic mass. Family reports recent decline in overall health.  Type of Study: Bedside Swallow Evaluation Previous Swallow Assessment: none in chart Diet Prior to this Study: NPO Temperature Spikes Noted: No Respiratory Status: Room air History of Recent Intubation: No Behavior/Cognition: Alert Oral Cavity Assessment: Within Functional Limits Oral Care Completed by SLP: Recent completion by staff Oral Cavity - Dentition:  (natural top, edentulous bottom) Vision: Functional for self-feeding Self-Feeding Abilities: Needs assist Patient Positioning: Upright in bed Baseline Vocal Quality: Low vocal intensity Volitional Cough: Weak Volitional Swallow: Unable to elicit    Oral/Motor/Sensory Function Overall Oral Motor/Sensory Function: Severe impairment Facial ROM: Reduced left Facial Symmetry: Abnormal symmetry left Facial Strength: Reduced left Facial Sensation: Reduced left Lingual ROM: Reduced left Lingual Symmetry: Abnormal symmetry left Lingual Strength: Reduced Mandible: Impaired   Ice Chips Ice chips: Impaired Presentation: Spoon Oral Phase Impairments: Reduced lingual movement/coordination;Impaired mastication Oral Phase Functional Implications: Prolonged oral transit   Thin Liquid Thin Liquid: Impaired Presentation: Spoon;Straw;Cup Oral Phase Impairments: Reduced labial seal Oral Phase Functional Implications: Left anterior spillage Pharyngeal  Phase Impairments: Cough - Immediate (with multiple sips)    Nectar Thick Nectar Thick Liquid: Not tested   Honey Thick Honey Thick Liquid: Not tested   Puree Puree: Impaired Presentation: Spoon Oral Phase Impairments: Reduced labial seal;Reduced lingual movement/coordination;Impaired mastication Oral Phase Functional  Implications: Left anterior spillage;Left lateral sulci pocketing;Prolonged oral transit;Oral residue Pharyngeal Phase Impairments: Cough - Immediate   Solid     Solid: Not tested     Emmi Wertheim B. Rutherford Nail M.S., CCC-SLP, [place] Office [phone]  Peighton Mehra Rutherford Nail 04/26/2020,2:24 PM

## 2020-04-26 NOTE — Evaluation (Signed)
Physical Therapy Evaluation Patient Details Name: Chelsea Davis MRN: [id number] DOB: June 10, 1936 Today's Date: 04/26/2020   History of Present Illness  presented to ER secondary to acute onset of L-sided weakness, slurred speech; admitted for TIA/CVA work up. MRI significant for multifocal cerebral and cerebellar infarcts (largest in L caudate, R hippocampal region and L corona radiata), likely embolic in nature.  Clinical Impression  Patient resting in supine, son present at bedside.  Alert and oriented to basic information; follows simple commands.  Generally focused/perseverative on need for transferring to [facility].  Patient severely dysarthric with noted L facial droop; significant difficulty expressing wants/needs verbally.  Globally weak and deconditioned throughout all extremities (family endorses decline in recent months), but acute weakness to L UE > LE (2+ to 3-/5) with moderate inattention noted during mobility attempts.  Patient denies sensory deficit, but do question full sensory awareness to L UE especially.  Currently requiring max assist for bed mobility; close sup for unsupported static sitting balance; max assist +1-2 for sit/stand, basic transfers between seating surfaces. Transfers require extensive assist for lift off, standing balance; unable to consistently co-contract L LE in closed-chain for stability in loading. Unsafe/unable to attempt gait efforts at this time. Would benefit from skilled PT to address above deficits and promote optimal return to PLOF.; recommend transition to STR upon discharge from acute hospitalization.  May also be appropriate for palliative care consult to discuss longer-term goals of care given full medical picture.     Follow Up Recommendations  (Palliative care consult)    Equipment Recommendations       Recommendations for Other Services       Precautions / Restrictions Precautions Precautions: Fall Restrictions Weight Bearing Restrictions: No       Mobility  Bed Mobility Overal bed mobility: Needs Assistance Bed Mobility: Supine to Sit     Supine to sit: Max assist     General bed mobility comments: for LE management, truncal elevation  Transfers Overall transfer level: Needs assistance Equipment used: None Transfers: Stand Pivot Transfers   Stand pivot transfers: Max assist;+2 safety/equipment       General transfer comment: extensive assist for lift off, standing balance; unable to consistently co-contract L LE in closed-chain for stability in loading  Ambulation/Gait             General Gait Details: unsafe/unable  Stairs            Wheelchair Mobility    Modified Rankin (Stroke Patients Only)       Balance Overall balance assessment: Needs assistance Sitting-balance support: No upper extremity supported;Feet supported Sitting balance-Leahy Scale: Fair Sitting balance - Comments: close sup once oriented to midline/position; limited ability to respond to external perturbations or weight shift outside immediate BOS   Standing balance support: No upper extremity supported Standing balance-Leahy Scale: Zero Standing balance comment: extensive physical assist to attain/maintain                             Pertinent Vitals/Pain Pain Assessment: No/denies pain    Home Living Family/patient expects to be discharged to:: Private residence Living Arrangements: Spouse/significant other Available Help at Discharge: Family;Available PRN/intermittently Type of Home: House Home Access: Stairs to enter   [employer] of Steps: 4 steps; family has been taking up/down in [facility] as needed Home Layout: One level Home Equipment: Wheelchair - manual      Prior Function Level of Independence: Needs assistance  Comments: HHA from family for limited household ambulation; manual WC as primary mobility in/out of house and within the community when required.  Assist from  family as needed for ADLs (sponge bath) and household responsibilities.  Does endorse multiple fall history (at least 3 in previous six months) with progressive weakness/functional decline in recent months     Hand Dominance   Dominant Hand: Right    Extremity/Trunk Assessment   Upper Extremity Assessment Upper Extremity Assessment: Generalized weakness (R UE grossly 3+ to 4-/5; L UE grossly 2+ to 3-/5.  Mod inattention to L UE, questionable sensory deficit)    Lower Extremity Assessment Lower Extremity Assessment: Generalized weakness (R LE grossly 3+ to 4-/5 throughout; L LE grossly 3-/5 throughout.  Fair isolated activation; limited functional use/activation in closed-chain wiht divided attention)       Communication   Communication:  (severely dysarthric, marked difficulty communicating wants/needs)  Cognition Arousal/Alertness: Awake/alert Behavior During Therapy: WFL for tasks assessed/performed Overall Cognitive Status: Difficult to assess                                 General Comments: oriented to self, location, follows simple commands      General Comments      Exercises Other Exercises Other Exercises: Toilet transfer, SPT without assist device, max assis t+1-2 for lift off, standing balance and pivot; unable to functionally initiate stepping due to LE weakness, poor L LE stability.  Dep of second person for hygiene   Assessment/Plan    PT Assessment Patient needs continued PT services  PT Problem List Decreased strength;Decreased range of motion;Decreased balance;Decreased activity tolerance;Decreased mobility;Decreased coordination;Decreased cognition;Decreased knowledge of use of DME;Decreased safety awareness;Decreased knowledge of precautions;Cardiopulmonary status limiting activity;Impaired sensation       PT Treatment Interventions DME instruction;Gait training;Functional mobility training;Therapeutic activities;Therapeutic exercise;Balance  training;Neuromuscular re-education;Cognitive remediation;Patient/family education    PT Goals (Current goals can be found in the Care Plan section)  Acute Rehab PT Goals Patient Stated Goal: to go to the bathroom PT Goal Formulation: With patient/family Time For Goal Achievement: 05/10/20 Potential to Achieve Goals: Fair    Frequency Min 2X/week   Barriers to discharge Decreased caregiver support      Co-evaluation               AM-PAC PT "6 Clicks" Mobility  Outcome Measure Help needed turning from your back to your side while in a flat bed without using bedrails?: A Lot Help needed moving from lying on your back to sitting on the side of a flat bed without using bedrails?: Total Help needed moving to and from a bed to a chair (including a wheelchair)?: Total Help needed standing up from a chair using your arms (e.g., wheelchair or bedside chair)?: Total Help needed to walk in hospital room?: Total Help needed climbing 3-5 steps with a railing? : Total 6 Click Score: 7    End of Session Equipment Utilized During Treatment: Gait belt Activity Tolerance: Patient tolerated treatment well Patient left: in chair;with call bell/phone within reach;with chair alarm set;with family/visitor present Nurse Communication: Mobility status PT Visit Diagnosis: Muscle weakness (generalized) (M62.81);Difficulty in walking, not elsewhere classified (R26.2)    Time: 2426-8341 PT Time Calculation (min) (ACUTE ONLY): 27 min   Charges:   PT Evaluation $PT Eval Moderate Complexity: 1 Mod PT Treatments $Therapeutic Activity: 23-37 mins      Sharalee Witman H. Owens Shark, PT, DPT, NCS 04/26/20, 11:19  AM [phone]

## 2020-04-26 NOTE — Progress Notes (Signed)
  Chaplain On-Call responded to Order Requisition regarding Advance Directives information.  Chaplain provided the AD documents to the patient's son due to patient being unavailable during bedside procedure.  Chaplain encouraged reading and discussion of the documents, and stated that Chaplains can be contacted if further assistance is needed.  [place] Adaliah Hiegel M.Div., [facility]

## 2020-04-26 NOTE — Progress Notes (Signed)
Patient assisted to bedside commode with two person assist, due to not being able to utilize the bedpan. No results noted. Assisted back to bed. Bladder scan performed with 215 as result. No other issues noted. Will continue to monitor.

## 2020-04-26 NOTE — Progress Notes (Signed)
Dr Reesa Chew made aware that NT noticed a change in the pt, tried to assist pt to bathroom and pt went limp, did not lose consciousness, assisted back in bed and NT states that pt not responding as before, VSS, also aware that the NIH has increased significantly from previous, states that she is going to get in touch with palliative and family is at the bedside-states that she will come assess pt and speak with family

## 2020-04-26 NOTE — Consult Note (Addendum)
Consultation Note Date: 04/26/2020   Patient Name: Chelsea Davis  DOB: October 14, 1935  MRN: [id number]  Age / Sex: 84 y.o., female  PCP: [name], MD Referring Physician: Lorella Nimrod, MD  Reason for Consultation: Establishing goals of care  HPI/Patient Profile: Chelsea Davis a 84 y.o.femalewith medical history significant ofhypertension, hyperlipidemia, diabetes mellitus, CKD-3,dCHF, hypothyroidism, malignant neoplasm of vulva(s/p of surgery in [facility]), pancreatic mass, who presents with left-sided weakness and slurred speech.  Clinical Assessment and Goals of Care: Request by MD to see patient ASAP due to decline in patient status.   Upon entering room, patient is resting in bed speaking with daughter. Patient's husband and son are present at bedside as well.  They are actively discussing shifting to comfort care.  Daughter is a Marine scientist here at [employer] and understands what that path would look like, and was explaining it to her mother very accurately.  Patient nods and shakes her head appropriately to questions including identifying who is at bedside (when asked multiple options for son such as son, brother, cousin), but does not speak. She indicates she just wants to focus on comfort.   They discussed life for Ms. Boord over the past month since being diagnosed with her pancreatic mass.  They state that she has been through a lot of testing and has declined during this time.  Family states prior to this time she was independent and enjoyed going to church.  We discussed her diagnoses, prognosis, GOC, EOL wishes disposition and options.  A detailed discussion was had today regarding advanced directives.  Concepts specific to code status, artifical feeding and hydration, IV antibiotics and rehospitalization were discussed.  The difference between an aggressive medical intervention path and a comfort care  path was discussed.  Values and goals of care important to patient and family were attempted to be elicited.  Discussed limitations of medical interventions to prolong quality of life in some situations and discussed the concept of human mortality.  Patient and family would like to go home with hospice tomorrow, and focus on comfort until death. They would like to shift to comfort now. They do not want continued aggressive support, ventilator support or CPR.   Family is aware if she is not stable for transport tomorrow, she may stay here for transition to end of life. It is most important to her and her family to be together at time of death.    I completed a MOST form today with patient, husband, daughter, and son, and the signed original was placed in the chart. A photocopy was placed in the chart to be scanned into EMR. The patient outlined their wishes for the following treatment decisions:  Cardiopulmonary Resuscitation: Do Not Attempt Resuscitation (DNR/No CPR)  Medical Interventions: Comfort Measures: Keep clean, warm, and dry. Use medication by any route, positioning, wound care, and other measures to relieve pain and suffering. Use oxygen, suction and manual treatment of airway obstruction as needed for comfort. Do not transfer to the hospital unless comfort needs  cannot be met in current location.  Antibiotics: No antibiotics (use other measures to relieve symptoms)  IV Fluids: No IV fluids (provide other measures to ensure comfort)  Feeding Tube: No feeding tube        [place] with hospice tomorrow.     Prognosis:   < 2 weeks Pancreatic mass. Multiple acute CVA's affecting speech, swallowing.  Discharge Planning: Home with Hospice      Primary Diagnoses: Present on Admission: . Pancreatic mass . Stroke ([place]) . Chronic diastolic heart failure ([place]) . Hypertension . Type II diabetes mellitus with renal manifestations ([place]) . Acute renal  failure superimposed on stage 3a chronic kidney disease ([place]) . Hyperlipidemia . Essential hypertension . Leukocytosis . Thrombocytopenia ([place]) . CVA (cerebral vascular accident) ([place])   I have reviewed the medical record, interviewed the patient and family, and examined the patient. The following aspects are pertinent.  Past Medical History:  Diagnosis Date  . Benign tumor of labia majora    a. resected/[facility].  . Chronic diastolic CHF (congestive heart failure) ([place])    a. 03/2013 Neg MV;  b. 10/2013 Echo: EF 65-70%, LVH, diast dysfxn, Triv MR/TR, mildly dil LA.  . CKD (chronic kidney disease), stage III    a. 06/2013 Renal Duplex: nl renal arteries.  . Diabetes mellitus without complication ([place])   . Essential hypertension   . Hyperlipidemia    Social History   Socioeconomic History  . Marital status: Married    Spouse name: Not on file  . Number of children: Not on file  . Years of education: Not on file  . Highest education level: Not on file  Occupational History  . Not on file  Tobacco Use  . Smoking status: Never Smoker  . Smokeless tobacco: Never Used  Vaping Use  . Vaping Use: Never used  Substance and Sexual Activity  . Alcohol use: No  . Drug use: No  . Sexual activity: Not on file  Other Topics Concern  . Not on file  Social History Narrative  . Not on file   Social Determinants of Health   Financial Resource Strain:   . Difficulty of Paying Living Expenses:   Food Insecurity:   . Worried About Charity fundraiser in the Last Year:   . Arboriculturist in the Last Year:   Transportation Needs:   . Film/video editor (Medical):   Marland Kitchen Lack of Transportation (Non-Medical):   Physical Activity:   . Days of Exercise per Week:   . Minutes of Exercise per Session:   Stress:   . Feeling of Stress :   Social Connections:   . Frequency of Communication with Friends and Family:   . Frequency of Social Gatherings with Friends and Family:   . Attends  Religious Services:   . Active Member of Clubs or Organizations:   . Attends Archivist Meetings:   Marland Kitchen Marital Status:    Family History  Problem Relation Age of Onset  . Hypertension Mother   . Hypertension Father   . Colon cancer Father   . Stomach cancer Sister    Scheduled Meds: . aspirin EC  81 mg Oral Daily  . [START ON 04/27/2020] atorvastatin  40 mg Oral QPM  . insulin aspart  0-5 Units Subcutaneous QHS  . insulin aspart  0-9 Units Subcutaneous TID WC   Continuous Infusions: . sodium chloride 100 mL/hr at 04/26/20 1141   PRN Meds:.acetaminophen **OR**  acetaminophen (TYLENOL) oral liquid 160 mg/5 mL **OR** acetaminophen, albuterol, hydrALAZINE, ondansetron (ZOFRAN) IV, senna-docusate Medications Prior to Admission:  Prior to Admission medications   Medication Sig Start Date End Date Taking? Authorizing Provider  albuterol (PROAIR HFA) 108 (90 Base) MCG/ACT inhaler Inhale 2 puffs into the lungs every 6 (six) hours as needed for wheezing or shortness of breath.    Yes [provider]  amLODipine (NORVASC) 5 MG tablet Take 5 mg by mouth daily.  12/28/18   [provider]  atorvastatin (LIPITOR) 20 MG tablet Take 20 mg by mouth daily.     [provider]  lipase/protease/amylase (CREON) 36000 UNITS CPEP capsule Take 1 capsule (36,000 Units total) by mouth 3 (three) times daily with meals. May also take 1 capsule (36,000 Units total) as needed (with snacks). 04/19/20   Earlie Server, MD  losartan (COZAAR) 100 MG tablet Take 100 mg by mouth daily.     [provider]   Allergies  Allergen Reactions  . Iodinated Diagnostic Agents Hives, Rash and Swelling  . Amlodipine     Other reaction(s): Unknown edema  . Coreg [Carvedilol]     Bradycardia   . Dye Fdc Red [Red Dye]   . Iodides Hives, Rash and Swelling  . Metformin Diarrhea  . Spironolactone Rash   Review of Systems  All other systems reviewed and are negative.   Physical  Exam Pulmonary:     Effort: Pulmonary effort is normal.  Skin:    General: Skin is warm and dry.  Neurological:     Mental Status: She is alert.     Vital Signs: BP (!) 149/55 (BP Location: Left Arm)   Pulse 67   Temp 97.9 F (36.6 C) (Oral)   Resp 16   Ht 4' 8"  (1.422 m)   Wt 47.6 kg   SpO2 100%   BMI 23.54 kg/m  Pain Scale: 0-10   Pain Score: 0-No pain   SpO2: SpO2: 100 % O2 Device:SpO2: 100 % O2 Flow Rate: .   IO: Intake/output summary:   Intake/Output Summary (Last 24 hours) at 04/26/2020 1531 Last data filed at 04/26/2020 1200 Gross per 24 hour  Intake 810.31 ml  Output --  Net 810.31 ml    LBM: Last BM Date: 04/25/20 Baseline Weight: Weight: 47.6 kg Most recent weight: Weight: 47.6 kg     Palliative Assessment/Data: 20%   MD and RN aware.   Time In: 3:00 Time Out: 3:50 Time Total: 50 min Greater than 50%  of this time was spent counseling and coordinating care related to the above assessment and plan.  Signed by: Asencion Gowda, NP   Please contact Palliative Medicine Team phone at [phone] for questions and concerns.  For individual provider: See Shea Evans

## 2020-04-26 NOTE — Progress Notes (Signed)
PROGRESS NOTE    Chelsea Davis  VOH:[id number] DOB: 03-15-1936 DOA: 04/25/2020 PCP: [name], MD   Brief Narrative:  Chelsea Davis is a 84 y.o. female with medical history significant of hypertension, hyperlipidemia, diabetes mellitus, CKD-3, dCHF, hypothyroidism, malignant neoplasm of vulva (s/p of surgery in [facility]), pancreatic mass, who presents with left-sided weakness and slurred speech. She had PET scan yesterday, the findings are with high suspicion for malignancy.   Found to have a multifocal infarct involving multiple locations on MRI.  Most likely secondary to hypercoagulable stage with malignancy. Patient is dependent for her ADLs at baseline.  Subjective: Patient was nonverbal when seen today.  She was just keeps staring at me.  Appears very lethargic.  Able to follow commands.  Assessment & Plan:   Principal Problem:   Stroke [facility]) Active Problems:   Chronic diastolic heart failure (HCC)   Hypertension   Type II diabetes mellitus with renal manifestations (HCC)   Acute renal failure superimposed on stage 3a chronic kidney disease (HCC)   Hyperlipidemia   Essential hypertension   Pancreatic mass   Leukocytosis   Thrombocytopenia (HCC)   CVA (cerebral vascular accident) ([place])  Multifocal stroke.  MRI with multifocal infarct involving different territories, more consistent with embolic phenomenon.  Patient might have hypercoagulability stage due to her malignancy. Echocardiogram with hypertrophic cardiomyopathy, hyperdynamic EF and grade 2 diastolic dysfunction.  A1c at 5.2, left third profile of total cholesterol of 194, triglyceride of 206, HDL of 18 and LDL of 135.  INR elevated at 1.5.  Worsening T bili at 2.1.  More consistent with hepatic dysfunction. Patient failed swallow evaluation with severe oropharyngeal dysfunction.  At baseline she was just eating couple of bites on daily basis. Patient has very poor functional status at baseline.  Dependent for her  ADLs.   Tried talking with her son at bedside and he wants to discuss with rest of the family before making any decision respect of the goals of care.  At this time patient is full code. -Palliative care was consulted. -Patient is appropriate for transition to comfort measures. -Patient is high risk for deterioration and death due to multiple comorbidities, malignancy and recent multifocal stroke.  Chronic diastolic heart failure ([place]):  Pete echocardiogram with persistent of EF of 75% and grade 2 diastolic dysfunction.  BNP elevated at 560.  Patient does not appear volume up clinically.  Patient has CKD which can cause false elevation in BNP. -Continue to monitor.  Hypertension.  Blood pressure mildly elevated. -Holding home antihypertensives for permissive hypertension.  AKI with CKD stage IIIa.  Creatinine elevated with baseline around 1.3-1.5. -Patient was started on IV fluid. -Continue to monitor. -Avoid nephrotoxins.  Type II diabetes mellitus with renal manifestations [facility]): Repeat A1c at 5.2.  Patient did had an episode of hypoglycemia. -Continue to monitor..  Hyperlipidemia -Lipitor  Pancreatic mass: likely magaliancy -f/u with oncology  Leukocytosis: WBC 12.6.  No fever or other signs of infection.  Likely reactive -f/u by CBC   Thrombocytopenia ([place]): Platelet 74.  Etiology is not clear. -LDH elevated with nonspecific smear review.  Compensated microangiopathy cannot be excluded.  They are recommending work-up for hemolysis and coagulopathy.  Objective: Vitals:   04/26/20 0638 04/26/20 0803 04/26/20 1314 04/26/20 1423  BP: (!) 152/58 (!) 147/52 (!) 163/64 (!) 149/55  Pulse: 66 60 62 67  Resp: 16 20 16    Temp: 97.7 F (36.5 C) 97.8 F (36.6 C) (!) 96.9 F (36.1 C) 97.9  F (36.6 C)  TempSrc: Oral Oral Axillary Oral  SpO2: 100% 100% 100% 100%  Weight:      Height:        Intake/Output Summary (Last 24 hours) at 04/26/2020 1443 Last data filed at 04/26/2020  1200 Gross per 24 hour  Intake 1810.31 ml  Output --  Net 1810.31 ml   Filed Weights   04/25/20 1119  Weight: 47.6 kg    Examination:  General exam: Chronically ill-appearing, 84 frail elderly lady, in no acute distress. Respiratory system: Clear to auscultation. Respiratory effort normal. Cardiovascular system: S1 & S2 heard, RRR.  Gastrointestinal system: Soft, nontender, nondistended, bowel sounds positive. Central nervous system: Alert and oriented.  4/5 in left upper extremity, bilateral lower extremity 3/5. Extremities: No edema, no cyanosis, pulses intact and symmetrical. Psychiatry: Judgement and insight appear impaired.  DVT prophylaxis: SCDs Code Status: Full Family Communication: Tried explaining to son at bedside he would like to discuss with the family before making any decision. Disposition Plan:  Status is: Inpatient  Remains inpatient appropriate because:Inpatient level of care appropriate due to severity of illness   Dispo: The patient is from: Home              Anticipated d/c is to: To be determined              Anticipated d/c date is: 1 day              Patient currently is not medically stable to d/c.  Patient is very high risk for deterioration and death.  Appropriate for transitioning to comfort measures only.  Consultants:   Palliative care  Neurology  Procedures:  Antimicrobials:   Data Reviewed: I have personally reviewed following labs and imaging studies  CBC: Recent Labs  Lab 04/19/20 1603 04/25/20 1126  WBC 11.7* 12.6*  NEUTROABS 9.8* 9.6*  HGB 13.2 13.5  HCT 39.5 41.3  MCV 89.2 94.3  PLT 81* 74*   Basic Metabolic Panel: Recent Labs  Lab 04/19/20 1603 04/25/20 1126  NA 140 141  K 3.8 4.7  CL 105 104  CO2 21* 22  GLUCOSE 153* 125*  BUN 38* 48*  CREATININE 1.62* 1.81*  CALCIUM 8.3* 8.5*   GFR: Estimated Creatinine Clearance: 15.4 mL/min (A) (by C-G formula based on SCr of 1.81 mg/dL (H)). Liver Function Tests: Recent  Labs  Lab 04/19/20 1603 04/25/20 1126  AST 17 23  ALT 19 21  ALKPHOS 70 63  BILITOT 1.6* 2.1*  PROT 7.4 7.0  ALBUMIN 2.8* 2.6*   No results for input(s): LIPASE, AMYLASE in the last 168 hours. No results for input(s): AMMONIA in the last 168 hours. Coagulation Profile: Recent Labs  Lab 04/19/20 1603 04/25/20 1500  INR 1.3* 1.5*   Cardiac Enzymes: No results for input(s): CKTOTAL, CKMB, CKMBINDEX, TROPONINI in the last 168 hours. BNP (last 3 results) No results for input(s): PROBNP in the last 8760 hours. HbA1C: Recent Labs    04/26/20 0501  HGBA1C 5.2   CBG: Recent Labs  Lab 04/24/20 1243 04/25/20 1116 04/25/20 1711 04/25/20 2121 04/26/20 0753  GLUCAP 130* 124* 105* 108* 95   Lipid Profile: Recent Labs    04/26/20 0501  CHOL 194  HDL 18*  LDLCALC 135*  TRIG 206*  CHOLHDL 10.8   Thyroid Function Tests: No results for input(s): TSH, T4TOTAL, FREET4, T3FREE, THYROIDAB in the last 72 hours. Anemia Panel: No results for input(s): VITAMINB12, FOLATE, FERRITIN, TIBC, IRON, RETICCTPCT in the last 72 hours.  Sepsis Labs: No results for input(s): PROCALCITON, LATICACIDVEN in the last 168 hours.  Recent Results (from the past 240 hour(s))  C difficile quick screen w PCR reflex     Status: None   Collection Time: 04/21/20  9:16 AM   Specimen: STOOL  Result Value Ref Range Status   C Diff antigen NEGATIVE NEGATIVE Final   C Diff toxin NEGATIVE NEGATIVE Final   C Diff interpretation No C. difficile detected.  Final    Comment: Performed at [facility], [address] [place], [place] [address]  SARS Coronavirus 2 by RT PCR (hospital order, performed in [facility] hospital lab) Nasopharyngeal Nasopharyngeal Swab     Status: None   Collection Time: 04/25/20 11:26 AM   Specimen: Nasopharyngeal Swab  Result Value Ref Range Status   SARS Coronavirus 2 NEGATIVE NEGATIVE Final    Comment: (NOTE) SARS-CoV-2 target nucleic acids are NOT DETECTED.  The  SARS-CoV-2 RNA is generally detectable in upper and lower respiratory specimens during the acute phase of infection. The lowest concentration of SARS-CoV-2 viral copies this assay can detect is 250 copies / mL. A negative result does not preclude SARS-CoV-2 infection and should not be used as the sole basis for treatment or other patient management decisions.  A negative result may occur with improper specimen collection / handling, submission of specimen other than nasopharyngeal swab, presence of viral mutation(s) within the areas targeted by this assay, and inadequate number of viral copies (<250 copies / mL). A negative result must be combined with clinical observations, patient history, and epidemiological information.  Fact Sheet for Patients:   StrictlyIdeas.no  Fact Sheet for Healthcare Providers: BankingDealers.co.za  This test is not yet approved or  cleared by the [place] FDA and has been authorized for detection and/or diagnosis of SARS-CoV-2 by FDA under an Emergency Use Authorization (EUA).  This EUA will remain in effect (meaning this test can be used) for the duration of the COVID-19 declaration under Section 564(b)(1) of the Act, 21 U.S.C. section 360bbb-3(b)(1), unless the authorization is terminated or revoked sooner.  Performed at [facility], [address], [place], [place] [address]      Radiology Studies: CT HEAD WO CONTRAST  Result Date: 04/25/2020 CLINICAL DATA:  Left-sided facial droop and slurred speech. EXAM: CT HEAD WITHOUT CONTRAST TECHNIQUE: Contiguous axial images were obtained from the base of the skull through the vertex without intravenous contrast. COMPARISON:  CT scan 01/01/2018 FINDINGS: Brain: Stable mild age related cerebral atrophy, ventriculomegaly and periventricular white matter disease. Stable bilateral basal ganglia calcifications. No extra-axial fluid collections are  identified. No CT findings for acute hemispheric infarction or intracranial hemorrhage. No mass lesions. The brainstem and cerebellum are normal. Vascular: Stable vascular calcifications. No aneurysm or hyperdense vessels. Skull: No skull fracture or bone lesions. Sinuses/Orbits: The paranasal sinuses and mastoid air cells are clear. The globes are intact. Other: No scalp lesions or hematoma. IMPRESSION: 1. Stable mild age related cerebral atrophy, ventriculomegaly and periventricular white matter disease. 2. No acute intracranial findings or mass lesions. Electronically Signed   By: Marijo Sanes M.D.   On: 04/25/2020 12:20   MR ANGIO HEAD WO CONTRAST  Result Date: 04/25/2020 CLINICAL DATA:  Stroke EXAM: MRA HEAD WITHOUT CONTRAST TECHNIQUE: Angiographic images of the Circle of Willis were obtained using MRA technique without intravenous contrast. COMPARISON:  MRI head 04/25/2020 FINDINGS: Both vertebral arteries patent to the basilar. Mild stenosis distal left vertebral artery. Right PICA patent. Left PICA  not visualized. Basilar widely patent. Superior cerebellar and posterior cerebral arteries patent bilaterally. No significant stenosis. Fetal origin left posterior cerebral artery. Irregular narrowing of the internal carotid artery at the skull base on the left may be due to atherosclerotic disease or artifact. Right internal carotid artery widely patent. Cavernous carotid patent bilaterally. Anterior and middle cerebral arteries patent bilaterally. Irregularity of MCA branches bilaterally likely due to atherosclerotic disease. Mild irregularity in the anterior cerebral arteries. IMPRESSION: Negative for large vessel occlusion Atherosclerotic irregularity in the anterior and middle cerebral arteries bilaterally. Electronically Signed   By: Franchot Gallo M.D.   On: 04/25/2020 17:55   MR BRAIN WO CONTRAST  Result Date: 04/25/2020 CLINICAL DATA:  Focal neurological deficit EXAM: MRI HEAD WITHOUT CONTRAST  TECHNIQUE: Multiplanar, multiecho pulse sequences of the brain and surrounding structures were obtained without intravenous contrast. COMPARISON:  Prior same day head CT. FINDINGS: Brain: Multifocal scattered bilateral cerebral and cerebellar foci of restricted diffusion involving the left caudate, bilateral frontoparietal and occipital regions. The largest acute infarct involves the right corona radiata (5:34). There are also acute infarcts involving the right hippocampal tail and possibly dorsal left temporal region. Mild cerebral atrophy with ex vacuo dilatation. Background scattered T2/FLAIR hyperintense foci involving the periventricular white matter are nonspecific however commonly associated with chronic microvascular ischemic changes. No midline shift, mass lesion or extra-axial fluid collection. No intracranial hemorrhage. Vascular: Grossly normal flow voids. Skull and upper cervical spine: Normal marrow signal. Sinuses/Orbits: Normal orbits. Clear paranasal sinuses. No mastoid effusion. Other: Partially imaged left cheek cystic lesion. IMPRESSION: Multifocal cerebral and cerebellar acute infarcts, likely embolic with involvement of the left caudate and right hippocampal tail. The largest infarct involves the right corona radiata. Mild cerebral atrophy. Mild background chronic microvascular ischemic changes. Indeterminate left cheek cystic lesion. Correlation with patient history and physical exam is recommended. Consider outpatient dermatology consult. These results were called by telephone at the time of interpretation on 04/25/2020 at 2:31 pm to provider Lenise Arena , who verbally acknowledged these results. Electronically Signed   By: Primitivo Gauze M.D.   On: 04/25/2020 14:36   US Carotid Bilateral (at [facility] and [facility] only)  Result Date: 04/26/2020 CLINICAL DATA:  84 year old female with a history of stroke EXAM: BILATERAL CAROTID DUPLEX ULTRASOUND TECHNIQUE: Pearline Cables scale imaging, color Doppler  and duplex ultrasound were performed of bilateral carotid and vertebral arteries in the neck. COMPARISON:  None. FINDINGS: Criteria: Quantification of carotid stenosis is based on velocity parameters that correlate the residual internal carotid diameter with NASCET-based stenosis levels, using the diameter of the distal internal carotid lumen as the denominator for stenosis measurement. The following velocity measurements were obtained: RIGHT ICA:  Systolic 46 cm/sec, Diastolic 8 cm/sec CCA:  76 cm/sec SYSTOLIC ICA/CCA RATIO:  0.6 ECA:  66 cm/sec LEFT ICA:  Systolic 51 cm/sec, Diastolic 7 cm/sec CCA:  85 cm/sec SYSTOLIC ICA/CCA RATIO:  0.6 ECA:  69 cm/sec Right Brachial SBP: Not acquired Left Brachial SBP: Not acquired RIGHT CAROTID ARTERY: No significant calcifications of the right common carotid artery. Intermediate waveform maintained. Heterogeneous and partially calcified plaque at the right carotid bifurcation. No significant lumen shadowing. Low resistance waveform of the right ICA. No significant tortuosity. RIGHT VERTEBRAL ARTERY: Antegrade flow with low resistance waveform. LEFT CAROTID ARTERY: No significant calcifications of the left common carotid artery. Intermediate waveform maintained. Heterogeneous and partially calcified plaque at the left carotid bifurcation without significant lumen shadowing. Low resistance waveform of the left ICA. No significant tortuosity. LEFT  VERTEBRAL ARTERY:  Antegrade flow with low resistance waveform. IMPRESSION: Color duplex indicates minimal heterogeneous and calcified plaque, with no hemodynamically significant stenosis by duplex criteria in the extracranial cerebrovascular circulation. Signed, Dulcy Fanny. Dellia Nims, RPVI [facility] Radiology Electronically Signed   By: Corrie Mckusick D.O.   On: 04/26/2020 09:27   ECHOCARDIOGRAM COMPLETE  Result Date: 04/26/2020    ECHOCARDIOGRAM REPORT   Patient Name:   PRENTISS HAMMETT Date of Exam: 04/26/2020 Medical Rec #:  [id number]       Height:       56.0 in Accession #:    [id number]      Weight:       105.0 lb Date of Birth:  09/20/1936        BSA:          1.350 m Patient Age:    73 years        BP:           147/52 mmHg Patient Gender: F               HR:           61 bpm. Exam Location:  [facility] Procedure: 2D Echo, Color Doppler and Cardiac Doppler Indications:     I163.9 Stroke  History:         Patient has prior history of Echocardiogram examinations. CHF,                  CKD; Risk Factors:Hypertension, Diabetes and Dyslipidemia.  Sonographer:     Charmayne Sheer RDCS (AE) Referring Phys:  Baker Janus Soledad Gerlach NIU Diagnosing Phys: Kate Sable MD  Sonographer Comments: Suboptimal apical window and suboptimal subcostal window. IMPRESSIONS  1. Severe asymmetric septal hypertrophy, septal wall measuring upto 79mm. findings consistent with HCM. Left ventricular ejection fraction, by estimation, is 70 to 75%. The left ventricle has hyperdynamic function. The left ventricle has no regional wall motion abnormalities. There is severe asymmetric left ventricular hypertrophy of the septal segment. Left ventricular diastolic parameters are consistent with Grade II diastolic dysfunction (pseudonormalization).  2. Right ventricular systolic function is normal. The right ventricular size is normal. There is normal pulmonary artery systolic pressure.  3. The mitral valve is normal in structure. No evidence of mitral valve regurgitation.  4. The aortic valve is tricuspid. Aortic valve regurgitation is not visualized. Mild aortic valve sclerosis is present, with no evidence of aortic valve stenosis.  5. The inferior vena cava is normal in size with greater than 50% respiratory variability, suggesting right atrial pressure of 3 mmHg. FINDINGS  Left Ventricle: Severe asymmetric septal hypertrophy, septal wall measuring upto 12mm. findings consistent with HCM. Left ventricular ejection fraction, by estimation, is  70 to 75%. The left ventricle has hyperdynamic function. The left ventricle has no  regional wall motion abnormalities. The left ventricular internal cavity size was small. There is severe asymmetric left ventricular hypertrophy of the septal segment. Left ventricular diastolic parameters are consistent with Grade II diastolic dysfunction (pseudonormalization). Right Ventricle: The right ventricular size is normal. No increase in right ventricular wall thickness. Right ventricular systolic function is normal. There is normal pulmonary artery systolic pressure. The tricuspid regurgitant velocity is 2.52 m/s, and  with an assumed right atrial pressure of 3 mmHg, the estimated right ventricular systolic pressure is 87.8 mmHg. Left Atrium: Left atrial size was normal in size. Right Atrium: Right atrial size was normal in size. Pericardium: There is no evidence of  pericardial effusion. Mitral Valve: The mitral valve is normal in structure. No evidence of mitral valve regurgitation. MV peak gradient, 10.8 mmHg. The mean mitral valve gradient is 2.0 mmHg. Tricuspid Valve: The tricuspid valve is normal in structure. Tricuspid valve regurgitation is trivial. Aortic Valve: The aortic valve is tricuspid. Aortic valve regurgitation is not visualized. Mild aortic valve sclerosis is present, with no evidence of aortic valve stenosis. Aortic valve mean gradient measures 5.0 mmHg. Aortic valve peak gradient measures 11.3 mmHg. Aortic valve area, by VTI measures 2.06 cm. Pulmonic Valve: The pulmonic valve was not well visualized. Pulmonic valve regurgitation is not visualized. Aorta: The aortic root is normal in size and structure. Venous: The inferior vena cava is normal in size with greater than 50% respiratory variability, suggesting right atrial pressure of 3 mmHg. IAS/Shunts: No atrial level shunt detected by color flow Doppler.  LEFT VENTRICLE PLAX 2D LVIDd:         3.61 cm  Diastology LVIDs:         1.62 cm  LV e' lateral:    3.92 cm/s LV PW:         0.96 cm  LV E/e' lateral: 15.7 LV IVS:        1.26 cm  LV e' medial:    2.72 cm/s LVOT diam:     1.90 cm  LV E/e' medial:  22.6 LV SV:         65 LV SV Index:   48 LVOT Area:     2.84 cm  RIGHT VENTRICLE RV Basal diam:  1.87 cm LEFT ATRIUM             Index       RIGHT ATRIUM           Index LA diam:        2.60 cm 1.93 cm/m  RA Area:     10.30 cm LA Vol (A2C):   32.6 ml 24.15 ml/m RA Volume:   20.30 ml  15.04 ml/m LA Vol (A4C):   38.2 ml 28.30 ml/m LA Biplane Vol: 35.2 ml 26.08 ml/m  AORTIC VALVE                    PULMONIC VALVE AV Area (Vmax):    2.26 cm     PV Vmax:       1.48 m/s AV Area (Vmean):   2.52 cm     PV Vmean:      90.700 cm/s AV Area (VTI):     2.06 cm     PV VTI:        0.248 m AV Vmax:           168.00 cm/s  PV Peak grad:  8.8 mmHg AV Vmean:          103.000 cm/s PV Mean grad:  4.0 mmHg AV VTI:            0.317 m AV Peak Grad:      11.3 mmHg AV Mean Grad:      5.0 mmHg LVOT Vmax:         134.00 cm/s LVOT Vmean:        91.400 cm/s LVOT VTI:          0.230 m LVOT/AV VTI ratio: 0.73  AORTA Ao Root diam: 3.00 cm MITRAL VALVE                TRICUSPID VALVE MV Area (PHT): 2.11 cm  TR Peak grad:   25.4 mmHg MV Peak grad:  10.8 mmHg    TR Vmax:        252.00 cm/s MV Mean grad:  2.0 mmHg MV Vmax:       1.64 m/s     SHUNTS MV Vmean:      64.5 cm/s    Systemic VTI:  0.23 m MV Decel Time: 359 msec     Systemic Diam: 1.90 cm MV E velocity: 61.60 cm/s MV A velocity: 127.00 cm/s MV E/A ratio:  0.49 Kate Sable MD Electronically signed by Kate Sable MD Signature Date/Time: 04/26/2020/1:31:34 PM    Final     Scheduled Meds: . aspirin EC  81 mg Oral Daily  . [START ON 04/27/2020] atorvastatin  40 mg Oral QPM  . insulin aspart  0-5 Units Subcutaneous QHS  . insulin aspart  0-9 Units Subcutaneous TID WC   Continuous Infusions: . sodium chloride 100 mL/hr at 04/26/20 1141     LOS: 0 days   Time spent: 50 minutes.  I personally reviewed her chart and  imaging.  Lorella Nimrod, MD [facility]  If 7PM-7AM, please contact night-coverage [email or web address]  04/26/2020, 2:43 PM   This record has been created using Systems analyst. Errors have been sought and corrected,but may not always be located. Such creation errors do not reflect on the standard of care.

## 2020-04-26 NOTE — Evaluation (Signed)
Speech Language Pathology Evaluation Patient Details Name: Chelsea Davis MRN: [id number] DOB: [birth year] Today's Date: 04/26/2020 Time: 1150-1205 SLP Time Calculation (min) (ACUTE ONLY): 15 min  Problem List:  Patient Active Problem List   Diagnosis Date Noted  . CVA (cerebral vascular accident) ([place]) 04/26/2020  . Stroke ([place]) 04/25/2020  . Leukocytosis 04/25/2020  . Thrombocytopenia ([place]) 04/25/2020  . Goals of care, counseling/discussion 04/19/2020  . Diarrhea 04/19/2020  . Weight loss 04/19/2020  . Pancreatic mass 04/19/2020  . Sebaceous cyst 12/14/2018  . Laceration of face 01/13/2018  . Type 2 diabetes mellitus with hyperglycemia ([place]) 02/12/2016  . Acute renal failure superimposed on stage 3a chronic kidney disease ([place])   . Hyperlipidemia   . Essential hypertension   . Chronic diastolic CHF (congestive heart failure) ([place])   . Diabetes mellitus without complication ([place])   . Hypoglycemia associated with diabetes ([place]) 07/05/2014  . Long-term insulin use ([place]) 07/05/2014  . Type II or unspecified type diabetes mellitus with neurological manifestations, not stated as uncontrolled(250.60) 07/05/2014  . Hyperthyroidism without crisis 06/02/2014  . Routine history and physical examination of adult 04/05/2014  . Type II diabetes mellitus with renal manifestations ([place]) 03/07/2014  . Abdominal distension 02/01/2014  . Flatulence, eructation and gas pain 02/01/2014  . Constipation 12/16/2013  . Dyspepsia 11/16/2013  . Dyspepsia and other specified disorders of function of stomach 11/16/2013  . Dorsalgia, unspecified 10/21/2013  . Troponin level elevated 10/21/2013  . Chronic diastolic heart failure ([place])   . Hypertension   . Nontoxic uninodular goiter 02/02/2013  . Malignant neoplasm of vulva ([place]) 09/17/2012   Past Medical History:  Past Medical History:  Diagnosis Date  . Benign tumor of labia majora    a. resected/[facility].  . Chronic diastolic CHF (congestive heart  failure) ([place])    a. 03/2013 Neg MV;  b. 10/2013 Echo: EF 65-70%, LVH, diast dysfxn, Triv MR/TR, mildly dil LA.  . CKD (chronic kidney disease), stage III    a. 06/2013 Renal Duplex: nl renal arteries.  . Diabetes mellitus without complication ([place])   . Essential hypertension   . Hyperlipidemia    Past Surgical History:  Past Surgical History:  Procedure Laterality Date  . CYST EXCISION    . hysterectomy    . VULVA SURGERY     HPI:  84 y.o. female who presented to ER secondary to acute onset of L-sided weakness, slurred speech; admitted for TIA/CVA work up. MRI significant for multifocal cerebral and cerebellar infarcts (largest in L caudate, R hippocampal region and L corona radiata), likely embolic in nature. Past medical history includes hypertension, hyperlipidemia, diabetes mellitus, CKD-3, dCHF, hypothyroidism, malignant neoplasm of vulva (s/p surgery at [facility]), pancreatic mass. Family reports recent decline in overall health.    Assessment / Plan / Recommendation Clinical Impression  Pt presents with severe left facial and lingual weakness that results in unintelligible speech at the word level. Pt uses some gestures to indicate that she is thirsty but lacks communicative ability beyond that request. Pt is awake, alert and oriented to herself, family and place. Additionally, she demonstrates appropriate attention to various speakers in the room. Recommend family be present when pt needs to respond to information as they tend to understand her nonverbal responses.     SLP Assessment  SLP Recommendation/Assessment: Patient needs continued Speech Lanaguage Pathology Services SLP Visit Diagnosis: Dysarthria and anarthria (R47.1)    Follow Up Recommendations   (Hospice)    Frequency and Duration min 1 x/week  1  week      SLP Evaluation Cognition  Overall Cognitive Status: Difficult to assess Arousal/Alertness: Awake/alert Orientation Level: Oriented to person;Oriented to place        Comprehension  Auditory Comprehension Overall Auditory Comprehension: Appears within functional limits for tasks assessed Visual Recognition/Discrimination Discrimination: Not tested Reading Comprehension Reading Status: Not tested    Expression Expression Primary Mode of Expression: Verbal Verbal Expression Overall Verbal Expression: Impaired (d/t severe dysarthria) Written Expression Dominant Hand: Right   Oral / Motor  Oral Motor/Sensory Function Overall Oral Motor/Sensory Function: Severe impairment Facial ROM: Reduced left Facial Symmetry: Abnormal symmetry left Facial Strength: Reduced left Facial Sensation: Reduced left Lingual ROM: Reduced left Lingual Symmetry: Abnormal symmetry left Lingual Strength: Reduced Mandible: Impaired Motor Speech Overall Motor Speech: Impaired Respiration: Within functional limits Phonation: Low vocal intensity Articulation: Impaired Level of Impairment: Word Intelligibility: Intelligibility reduced Word: 0-24% accurate Phrase: 0-24% accurate Motor Planning: Witnin functional limits Motor Speech Errors: Not applicable   GO                   Chelsea Davis B. Rutherford Nail M.S., CCC-SLP, [place] [facility] [phone]  Chelsea Davis 04/26/2020, 2:08 PM

## 2020-04-26 NOTE — Progress Notes (Signed)
Subjective: No new neurological complaints.  Continued left sided weakness  Objective: Current vital signs: BP (!) 147/52 (BP Location: Left Arm)   Pulse 60   Temp 97.8 F (36.6 C) (Oral)   Resp 20   Ht 4\' 8"  (1.422 m)   Wt 47.6 kg   SpO2 100%   BMI 23.54 kg/m  Vital signs in last 24 hours: Temp:  [97.5 F (36.4 C)-98.6 F (37 C)] 97.8 F (36.6 C) (07/21 0803) Pulse Rate:  [58-69] 60 (07/21 0803) Resp:  [12-20] 20 (07/21 0803) BP: (139-167)/(47-104) 147/52 (07/21 0803) SpO2:  [99 %-100 %] 100 % (07/21 0803) Weight:  [47.6 kg] 47.6 kg (07/20 1119)  Intake/Output from previous day: 07/20 0701 - 07/21 0700 In: 1668.3 [I.V.:1668.3] Out: -  Intake/Output this shift: No intake/output data recorded. Nutritional status:  Diet Order            Diet NPO time specified  Diet effective now                 Neurologic Exam: Mental Status: Alert.  Markedly dysarthric but does not appear aphasic.  Able to follow commands without difficulty. Cranial Nerves: II: Visual fields grossly normal, pupils equal, round, reactive to light and accommodation III,IV, VI: ptosis not present, extra-ocular motions intact bilaterally V,VII: left facial droop, facial light touch sensation normal bilaterally VIII: hearing normal bilaterally IX,X: gag reflex present XI: bilateral shoulder shrug XII: midline tongue extension Motor: LUE 4-/5, LLE 2/5 Sensory: Pinprick and light touch intact throughout, bilaterally   Lab Results: Basic Metabolic Panel: Recent Labs  Lab 04/19/20 1603 04/25/20 1126  NA 140 141  K 3.8 4.7  CL 105 104  CO2 21* 22  GLUCOSE 153* 125*  BUN 38* 48*  CREATININE 1.62* 1.81*  CALCIUM 8.3* 8.5*    Liver Function Tests: Recent Labs  Lab 04/19/20 1603 04/25/20 1126  AST 17 23  ALT 19 21  ALKPHOS 70 63  BILITOT 1.6* 2.1*  PROT 7.4 7.0  ALBUMIN 2.8* 2.6*   No results for input(s): LIPASE, AMYLASE in the last 168 hours. No results for input(s): AMMONIA  in the last 168 hours.  CBC: Recent Labs  Lab 04/19/20 1603 04/25/20 1126  WBC 11.7* 12.6*  NEUTROABS 9.8* 9.6*  HGB 13.2 13.5  HCT 39.5 41.3  MCV 89.2 94.3  PLT 81* 74*    Cardiac Enzymes: No results for input(s): CKTOTAL, CKMB, CKMBINDEX, TROPONINI in the last 168 hours.  Lipid Panel: Recent Labs  Lab 04/26/20 0501  CHOL 194  TRIG 206*  HDL 18*  CHOLHDL 10.8  VLDL 41*  LDLCALC 135*    CBG: Recent Labs  Lab 04/24/20 1243 04/25/20 1116 04/25/20 1711 04/25/20 2121 04/26/20 0753  GLUCAP 130* 124* 105* 108* 95    Microbiology: Results for orders placed or performed during the hospital encounter of 04/25/20  SARS Coronavirus 2 by RT PCR (hospital order, performed in [facility] hospital lab) Nasopharyngeal Nasopharyngeal Swab     Status: None   Collection Time: 04/25/20 11:26 AM   Specimen: Nasopharyngeal Swab  Result Value Ref Range Status   SARS Coronavirus 2 NEGATIVE NEGATIVE Final    Comment: (NOTE) SARS-CoV-2 target nucleic acids are NOT DETECTED.  The SARS-CoV-2 RNA is generally detectable in upper and lower respiratory specimens during the acute phase of infection. The lowest concentration of SARS-CoV-2 viral copies this assay can detect is 250 copies / mL. A negative result does not preclude SARS-CoV-2 infection and should not be  used as the sole basis for treatment or other patient management decisions.  A negative result may occur with improper specimen collection / handling, submission of specimen other than nasopharyngeal swab, presence of viral mutation(s) within the areas targeted by this assay, and inadequate number of viral copies (<250 copies / mL). A negative result must be combined with clinical observations, patient history, and epidemiological information.  Fact Sheet for Patients:   StrictlyIdeas.no  Fact Sheet for Healthcare Providers: BankingDealers.co.za  This test is not yet  approved or  cleared by the [place] FDA and has been authorized for detection and/or diagnosis of SARS-CoV-2 by FDA under an Emergency Use Authorization (EUA).  This EUA will remain in effect (meaning this test can be used) for the duration of the COVID-19 declaration under Section 564(b)(1) of the Act, 21 U.S.C. section 360bbb-3(b)(1), unless the authorization is terminated or revoked sooner.  Performed at [facility], [address] [place], [place] [address]     Coagulation Studies: Recent Labs    04/25/20 1500  LABPROT 17.1*  INR 1.5*    Imaging: CT HEAD WO CONTRAST  Result Date: 04/25/2020 CLINICAL DATA:  Left-sided facial droop and slurred speech. EXAM: CT HEAD WITHOUT CONTRAST TECHNIQUE: Contiguous axial images were obtained from the base of the skull through the vertex without intravenous contrast. COMPARISON:  CT scan 01/01/2018 FINDINGS: Brain: Stable mild age related cerebral atrophy, ventriculomegaly and periventricular white matter disease. Stable bilateral basal ganglia calcifications. No extra-axial fluid collections are identified. No CT findings for acute hemispheric infarction or intracranial hemorrhage. No mass lesions. The brainstem and cerebellum are normal. Vascular: Stable vascular calcifications. No aneurysm or hyperdense vessels. Skull: No skull fracture or bone lesions. Sinuses/Orbits: The paranasal sinuses and mastoid air cells are clear. The globes are intact. Other: No scalp lesions or hematoma. IMPRESSION: 1. Stable mild age related cerebral atrophy, ventriculomegaly and periventricular white matter disease. 2. No acute intracranial findings or mass lesions. Electronically Signed   By: Marijo Sanes M.D.   On: 04/25/2020 12:20   MR ANGIO HEAD WO CONTRAST  Result Date: 04/25/2020 CLINICAL DATA:  Stroke EXAM: MRA HEAD WITHOUT CONTRAST TECHNIQUE: Angiographic images of the Circle of Willis were obtained using MRA technique without intravenous  contrast. COMPARISON:  MRI head 04/25/2020 FINDINGS: Both vertebral arteries patent to the basilar. Mild stenosis distal left vertebral artery. Right PICA patent. Left PICA not visualized. Basilar widely patent. Superior cerebellar and posterior cerebral arteries patent bilaterally. No significant stenosis. Fetal origin left posterior cerebral artery. Irregular narrowing of the internal carotid artery at the skull base on the left may be due to atherosclerotic disease or artifact. Right internal carotid artery widely patent. Cavernous carotid patent bilaterally. Anterior and middle cerebral arteries patent bilaterally. Irregularity of MCA branches bilaterally likely due to atherosclerotic disease. Mild irregularity in the anterior cerebral arteries. IMPRESSION: Negative for large vessel occlusion Atherosclerotic irregularity in the anterior and middle cerebral arteries bilaterally. Electronically Signed   By: Franchot Gallo M.D.   On: 04/25/2020 17:55   MR BRAIN WO CONTRAST  Result Date: 04/25/2020 CLINICAL DATA:  Focal neurological deficit EXAM: MRI HEAD WITHOUT CONTRAST TECHNIQUE: Multiplanar, multiecho pulse sequences of the brain and surrounding structures were obtained without intravenous contrast. COMPARISON:  Prior same day head CT. FINDINGS: Brain: Multifocal scattered bilateral cerebral and cerebellar foci of restricted diffusion involving the left caudate, bilateral frontoparietal and occipital regions. The largest acute infarct involves the right corona radiata (5:34). There are also acute infarcts involving  the right hippocampal tail and possibly dorsal left temporal region. Mild cerebral atrophy with ex vacuo dilatation. Background scattered T2/FLAIR hyperintense foci involving the periventricular white matter are nonspecific however commonly associated with chronic microvascular ischemic changes. No midline shift, mass lesion or extra-axial fluid collection. No intracranial hemorrhage. Vascular:  Grossly normal flow voids. Skull and upper cervical spine: Normal marrow signal. Sinuses/Orbits: Normal orbits. Clear paranasal sinuses. No mastoid effusion. Other: Partially imaged left cheek cystic lesion. IMPRESSION: Multifocal cerebral and cerebellar acute infarcts, likely embolic with involvement of the left caudate and right hippocampal tail. The largest infarct involves the right corona radiata. Mild cerebral atrophy. Mild background chronic microvascular ischemic changes. Indeterminate left cheek cystic lesion. Correlation with patient history and physical exam is recommended. Consider outpatient dermatology consult. These results were called by telephone at the time of interpretation on 04/25/2020 at 2:31 pm to provider Lenise Arena , who verbally acknowledged these results. Electronically Signed   By: Primitivo Gauze M.D.   On: 04/25/2020 14:36   US Carotid Bilateral (at [facility] and [facility] only)  Result Date: 04/26/2020 CLINICAL DATA:  84 year old female with a history of stroke EXAM: BILATERAL CAROTID DUPLEX ULTRASOUND TECHNIQUE: Pearline Cables scale imaging, color Doppler and duplex ultrasound were performed of bilateral carotid and vertebral arteries in the neck. COMPARISON:  None. FINDINGS: Criteria: Quantification of carotid stenosis is based on velocity parameters that correlate the residual internal carotid diameter with NASCET-based stenosis levels, using the diameter of the distal internal carotid lumen as the denominator for stenosis measurement. The following velocity measurements were obtained: RIGHT ICA:  Systolic 46 cm/sec, Diastolic 8 cm/sec CCA:  76 cm/sec SYSTOLIC ICA/CCA RATIO:  0.6 ECA:  66 cm/sec LEFT ICA:  Systolic 51 cm/sec, Diastolic 7 cm/sec CCA:  85 cm/sec SYSTOLIC ICA/CCA RATIO:  0.6 ECA:  69 cm/sec Right Brachial SBP: Not acquired Left Brachial SBP: Not acquired RIGHT CAROTID ARTERY: No significant calcifications of the right common carotid artery. Intermediate waveform maintained.  Heterogeneous and partially calcified plaque at the right carotid bifurcation. No significant lumen shadowing. Low resistance waveform of the right ICA. No significant tortuosity. RIGHT VERTEBRAL ARTERY: Antegrade flow with low resistance waveform. LEFT CAROTID ARTERY: No significant calcifications of the left common carotid artery. Intermediate waveform maintained. Heterogeneous and partially calcified plaque at the left carotid bifurcation without significant lumen shadowing. Low resistance waveform of the left ICA. No significant tortuosity. LEFT VERTEBRAL ARTERY:  Antegrade flow with low resistance waveform. IMPRESSION: Color duplex indicates minimal heterogeneous and calcified plaque, with no hemodynamically significant stenosis by duplex criteria in the extracranial cerebrovascular circulation. Signed, Dulcy Fanny. Dellia Nims, RPVI [facility] Radiology Electronically Signed   By: Corrie Mckusick D.O.   On: 04/26/2020 09:27   NM PET Image Initial (PI) Skull Base To Thigh  Result Date: 04/25/2020 CLINICAL DATA:  Initial treatment strategy for pancreatic tail mass. Remote history of vulvar squamous cell carcinoma. EXAM: NUCLEAR MEDICINE PET SKULL BASE TO THIGH TECHNIQUE: 8.3 mCi F-18 FDG was injected intravenously. Full-ring PET imaging was performed from the skull base to thigh after the radiotracer. CT data was obtained and used for attenuation correction and anatomic localization. Fasting blood glucose: 130 mg/dl COMPARISON:  04/13/2020 CT scan FINDINGS: Mediastinal blood pool activity: SUV max 3.8 Liver activity: SUV max 4.6 NECK: 3.0 by 2.9 cm solid left thyroid nodule has similar activity to the rest of thyroid is not hypermetabolic. This was previously shown on the CT cervical spine from 01/01/2018 and thyroid ultrasound  was recommended at that time. This has been evaluated on previous imaging, although correlation with patient history is recommended to  ensure that the recommended thyroid workup was performed. (Ref: J Am Coll Radiol. 2015 Feb;12(2): 143-50). Incidental CT findings: Small subcutaneous cystic lesion along the left face in similar lesions along the left upper back near the base of the neck, probably sebaceous cysts or similar benign lesions. CHEST: Right axillary lymph node 0.8 cm in short axis on image 75/4 with maximum SUV 6.0. Fullness of density along the left axillary neurovascular structures probably reflecting an adjacent lymph node, maximum SUV is only 2.7. Incidental CT findings: Coronary, aortic arch, and branch vessel atherosclerotic vascular disease. Mild cardiomegaly. Trace left pleural effusion and trace pericardial effusion. ABDOMEN/PELVIS: Abnormal expansion of the pancreatic tail corresponding to a mass with central necrosis and hypermetabolic rim, versus a mass associated with marked dilatation of the dorsal pancreatic duct. The abnormal hypermetabolic lesion has a maximum SUV of 10.0 and measures approximately 9.1 by 5.0 by 5.6 cm There is some accentuated signal tracking in the portal vein and superior mesenteric vein suspicious for tumor thrombus, maximum SUV 5.6 which is substantially higher than the normal blood pool. Small but hypermetabolic retrocrural, periaortic, common iliac, internal iliac, and external iliac adenopathy. Index retrocaval node measuring 0.8 cm in short axis on image 148/4, maximum SUV 8.7. Index left external iliac node 1.0 cm in short axis on image 200/4, maximum SUV 13.0. Index right external iliac node 1.2 cm in short axis on image 206/4, maximum SUV 12.0. Scattered accentuated metabolic activity in loops of bowel. The only region of moderate suspicion is the focal circumferential activity in the rectum corresponding to potential soft tissue prominence in the rectum in the vicinity of image 210/4, maximum SUV 19.6 Incidental CT findings: Aortoiliac atherosclerotic vascular disease. Mild ascites most  notable in the perihepatic region. SKELETON: No significant abnormal hypermetabolic activity in this region. Incidental CT findings: none IMPRESSION: 1. Unusual pattern of hypermetabolic adenopathy in the abdomen and pelvis along with a large centrally necrotic or cystic mass in the pancreatic tail with hypermetabolic margins. 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 High suspicion for malignancy but this would be an unusual pattern for typical pancreatic adenocarcinoma. There is also focal hypermetabolic activity in a region of soft tissue prominence in the rectum which could be a rectal adenocarcinoma. Differential diagnostic possibilities include metastatic rectal cancer, an unusual spread of pancreatic malignancy (with the rectal activity being spurious/physiologic), or lymphoma with pancreatic and possibly rectal involvement. If not recently performed, colonoscopy or at least sigmoidoscopy would be recommended to assess the potential mid rectal mass. Assuming that standard pancreatic protocol MRI with and without contrast cannot be performed for further characterization of the pancreatic mass, tissue diagnosis from the pancreas may be warranted. 2. There is also a hypermetabolic right axillary lymph node concerning for metastatic spread. Equivocal soft tissue density along the left axillary neurovascular structures without hypermetabolic activity. 3. Left thyroid nodule similar to appearance on 01/01/2018. This has been evaluated on previous imaging, at the time of prior characterization on 01/01/2018 a thyroid ultrasound was recommended, correlate with patient history in determining whether this was performed. (Ref: J Am Coll Radiol. 2015 Feb;12(2): 143-50). 4. Other imaging findings of potential clinical significance: Aortic Atherosclerosis (ICD10-I70.0). Coronary atherosclerosis with mild cardiomegaly. Trace left pleural effusion with trace pericardial effusion.  Small amount of ascites. Electronically Signed   By: Cindra Eves.D.  On: 04/25/2020 09:02    Medications:  I have reviewed the patient's current medications. Scheduled: . aspirin  300 mg Rectal Daily   Or  . aspirin EC  325 mg Oral Daily  . atorvastatin  20 mg Oral Daily  . insulin aspart  0-5 Units Subcutaneous QHS  . insulin aspart  0-9 Units Subcutaneous TID WC    Assessment/Plan: 84 y.o. female with medical history significant ofhypertension, hyperlipidemia, diabetes mellitus, CKD-3,dCHF, hypothyroidism, malignant neoplasm of vulva(s/p surgery at [facility]), pancreatic mass, who presents with left-sided weakness and slurred speech.  MRI of the brain personally reviewed and shows multifocal acute infarcts in multiple vascular distributions.  Embolic etiology likely.  Patient with recent diagnosis of cancer.  Can not rule out a hypercoagulable syndrome related to the same. MRA of the brain shows atherosclerotic irregularity in the anterior and middle cerebral arteries bilaterally but no evidence of LVO.   Carotid dopplers show no evidence of hemodynamically significant stenosis.  Echocardiogram pending.  A1c 7.2, LDL 135.     Stroke Risk Factors - diabetes mellitus, hyperlipidemia and hypertension  Plan: 1. Blood sugar management with target A1c<7.0 2. PT, OT, Speech therapy 3. Echocardiogram pending 4. Statin for lipid management with target LDL<70. 5. Prophylactic therapy-Antiplatelet med: Aspirin - dose 81 mg daily.  Will need to follow platelet count and discontinue if it continues to fall. Continue statin 6. NPO until RN stroke swallow screen 7. Telemetry monitoring 8. Frequent neuro checks    LOS: 0 days   Alexis Goodell, MD Neurology [phone] 04/26/2020  10:58 AM

## 2020-04-26 NOTE — Progress Notes (Signed)
OT Cancellation Note  Patient Details Name: Chelsea Davis MRN: [id number] DOB: Jun 24, 1936   Cancelled Treatment:    Reason Eval/Treat Not Completed: Other (comment). Consult received, chart reviewed. Noticed change of status in RN note this afternoon. Spoke to palliative care who just left pt room and indicating no further therapy needs at this time. Pt to be placed on comfort care. Will sign off at this time. Please re-consult if additional needs arise.   Jerilynn Birkenhead, OTS 04/26/20, 3:30 PM

## 2020-04-26 NOTE — Progress Notes (Signed)
PT Cancellation Note  Patient Details Name: Chelsea Davis MRN: [id number] DOB: [birth year]   Cancelled Treatment:    Reason Eval/Treat Not Completed: Medical issues which prohibited therapy (Per chart review, patient/family planning for transition to comfort care.  Order discontinued.  Please re-consult should needs change.)  [name] H. Owens Shark, PT, DPT, NCS 04/26/20, 4:27 PM [phone]

## 2020-04-26 NOTE — Progress Notes (Signed)
OT Cancellation Note  Patient Details Name: Chelsea Davis MRN: [id number] DOB: October 06, 1936   Cancelled Treatment:    Reason Eval/Treat Not Completed: Patient at procedure or test/ unavailable  OT consult received and chart reviewed. Pt off floor for [place] at this time. Will f/u for OT evaluation at later date/time as able. Thank you.  Gerrianne Scale, [place], OTR/L ascom [phone] 04/26/20, 8:27 AM

## 2020-04-26 NOTE — Progress Notes (Signed)
Palliative in the room now with pt and family

## 2020-04-27 ENCOUNTER — Ambulatory Visit: Admission: RE | Admit: 2020-04-27 | Payer: Medicare Other | Source: Ambulatory Visit

## 2020-04-27 ENCOUNTER — Inpatient Hospital Stay: Payer: Medicare Other

## 2020-04-27 MED ORDER — BIOTENE DRY MOUTH MT LIQD: 15.0000 mL | OROMUCOSAL | 1 refills | Status: AC | PRN

## 2020-04-27 MED ORDER — POLYVINYL ALCOHOL 1.4 % OP SOLN: 1.0000 [drp] | Freq: Four times a day (QID) | OPHTHALMIC | 0 refills | Status: AC | PRN

## 2020-04-27 NOTE — TOC Progression Note (Signed)
Transition of Care [facility]) - Progression Note    Patient Details  Name: Chelsea Davis MRN: [id number] Date of Birth: 09/06/1936  Transition of Care [facility]) CM/SW Contact  Shelbie Hutching, RN Phone Number: 04/27/2020, 12:42 PM  Clinical Narrative:    [facility] has been arranged for transportation.  Patient is 3rd on the list for pickup.    Expected Discharge Plan: Home w Hospice Care Barriers to Discharge: No Barriers Identified  Expected Discharge Plan and Services Expected Discharge Plan: [place]   Discharge Planning Services: CM Consult Post Acute Care Choice: Hospice Living arrangements for the past 2 months: Single Family Home Expected Discharge Date: 04/27/20               DME Arranged: Hospital bed DME Agency: Other - Comment ([place]) Date DME Agency Contacted: 04/27/20 Time DME Agency Contacted: 0900 Representative spoke with at DME Agency: Flo Shanks             Social Determinants of Health ([place]) Interventions    Readmission Risk Interventions No flowsheet data found.

## 2020-04-27 NOTE — Plan of Care (Signed)
  Problem: Education: Goal: Knowledge of disease or condition will improve Outcome: Adequate for Discharge Goal: Knowledge of secondary prevention will improve Outcome: Adequate for Discharge Goal: Knowledge of patient specific risk factors addressed and post discharge goals established will improve Outcome: Adequate for Discharge   Problem: Coping: Goal: Will verbalize positive feelings about self Outcome: Adequate for Discharge Goal: Will identify appropriate support needs Outcome: Adequate for Discharge   Problem: Health Behavior/Discharge Planning: Goal: Ability to manage health-related needs will improve Outcome: Adequate for Discharge   Problem: Self-Care: Goal: Ability to participate in self-care as condition permits will improve Outcome: Adequate for Discharge Goal: Verbalization of feelings and concerns over difficulty with self-care will improve Outcome: Adequate for Discharge Goal: Ability to communicate needs accurately will improve Outcome: Adequate for Discharge   Problem: Nutrition: Goal: Risk of aspiration will decrease Outcome: Adequate for Discharge   Problem: Intracerebral Hemorrhage Tissue Perfusion: Goal: Complications of Intracerebral Hemorrhage will be minimized Outcome: Adequate for Discharge   Problem: Ischemic Stroke/TIA Tissue Perfusion: Goal: Complications of ischemic stroke/TIA will be minimized Outcome: Adequate for Discharge   Problem: Spontaneous Subarachnoid Hemorrhage Tissue Perfusion: Goal: Complications of Spontaneous Subarachnoid Hemorrhage will be minimized Outcome: Adequate for Discharge   Problem: Education: Goal: Knowledge of the prescribed therapeutic regimen will improve Outcome: Adequate for Discharge   Problem: Coping: Goal: Ability to identify and develop effective coping behavior will improve Outcome: Adequate for Discharge   Problem: Clinical Measurements: Goal: Quality of life will improve Outcome: Adequate for  Discharge   Problem: Respiratory: Goal: Verbalizations of increased ease of respirations will increase Outcome: Adequate for Discharge   Problem: Role Relationship: Goal: Family's ability to cope with current situation will improve Outcome: Adequate for Discharge Goal: Ability to verbalize concerns, feelings, and thoughts to partner or family member will improve Outcome: Adequate for Discharge   Problem: Pain Management: Goal: Satisfaction with pain management regimen will improve Outcome: Adequate for Discharge

## 2020-04-27 NOTE — TOC Initial Note (Signed)
Transition of Care [facility]) - Initial/Assessment Note    Patient Details  Name: Chelsea Davis MRN: [id number] Date of Birth: 1936/06/19  Transition of Care [facility]) CM/SW Contact:    Shelbie Hutching, RN Phone Number: 04/27/2020, 11:42 AM  Clinical Narrative:                 Patient is discharging home today with [facility].  Patient will transport via [employer].  Hospital bed has been delivered.    Expected Discharge Plan: Home w Hospice Care Barriers to Discharge: No Barriers Identified   Patient Goals and CMS Choice Patient states their goals for this hospitalization and ongoing recovery are:: Home with hospice CMS Medicare.gov Compare Post Acute Care list provided to:: Patient Represenative (must comment) Choice offered to / list presented to : Spouse  Expected Discharge Plan and Services Expected Discharge Plan: [place]   Discharge Planning Services: CM Consult Post Acute Care Choice: Hospice Living arrangements for the past 2 months: [place]                 DME Arranged: Hospital bed DME Agency: Other - Comment ([place]) Date DME Agency Contacted: 04/27/20 Time DME Agency Contacted: 0900 Representative spoke with at DME Agency: Flo Shanks            Prior Living Arrangements/Services Living arrangements for the past 2 months: [place] Lives with:: Spouse Patient language and need for interpreter reviewed:: Yes Do you feel safe going back to the place where you live?: Yes      Need for Family Participation in Patient Care: Yes (Comment) (stroke) Care giver support system in place?: Yes (comment) (husband, children)   Criminal Activity/Legal Involvement Pertinent to Current Situation/Hospitalization: No - Comment as needed  Activities of Daily Living Home Assistive Devices/Equipment: Dentures (specify type), Shower chair with back, Raised toilet seat with rails, Hand-held shower hose, Wheelchair ADL Screening  (condition at time of admission) Patient's cognitive ability adequate to safely complete daily activities?: Yes Is the patient deaf or have difficulty hearing?: Yes Does the patient have difficulty seeing, even when wearing glasses/contacts?: Yes Does the patient have difficulty concentrating, remembering, or making decisions?: No Patient able to express need for assistance with ADLs?: No Does the patient have difficulty dressing or bathing?: Yes Independently performs ADLs?: No Communication: Independent Dressing (OT): Needs assistance Is this a change from baseline?: Pre-admission baseline Grooming: Needs assistance Is this a change from baseline?: Pre-admission baseline Feeding: Independent Bathing: Needs assistance Is this a change from baseline?: Pre-admission baseline Toileting: Needs assistance Is this a change from baseline?: Pre-admission baseline In/Out Bed: Needs assistance Is this a change from baseline?: Pre-admission baseline Walks in Home: Needs assistance Is this a change from baseline?: Pre-admission baseline Does the patient have difficulty walking or climbing stairs?: Yes Weakness of Legs: Left Weakness of Arms/Hands: Left  Permission Sought/Granted Permission sought to share information with : Case Manager, Family Supports, Other (comment) Permission granted to share information with : Yes, Verbal Permission Granted     Permission granted to share info w AGENCY: [place]        Emotional Assessment   Attitude/Demeanor/Rapport: Unable to Assess Affect (typically observed): Unable to Assess Orientation: : Oriented to Self Alcohol / Substance Use: Not Applicable Psych Involvement: No (comment)  Admission diagnosis:  Stroke [facility]) [I63.9] Cerebrovascular accident (CVA), unspecified mechanism ([place]) [I63.9] CVA (cerebral vascular accident) [facility]) [I63.9] Patient Active Problem List   Diagnosis  Date Noted  . CVA (cerebral vascular accident) ([place])  04/26/2020  . Stroke ([place]) 04/25/2020  . Leukocytosis 04/25/2020  . Thrombocytopenia ([place]) 04/25/2020  . Goals of care, counseling/discussion 04/19/2020  . Diarrhea 04/19/2020  . Weight loss 04/19/2020  . Pancreatic mass 04/19/2020  . Sebaceous cyst 12/14/2018  . Laceration of face 01/13/2018  . Type 2 diabetes mellitus with hyperglycemia ([place]) 02/12/2016  . Acute renal failure superimposed on stage 3a chronic kidney disease ([place])   . Hyperlipidemia   . Essential hypertension   . Chronic diastolic CHF (congestive heart failure) ([place])   . Diabetes mellitus without complication ([place])   . Hypoglycemia associated with diabetes ([place]) 07/05/2014  . Long-term insulin use ([place]) 07/05/2014  . Type II or unspecified type diabetes mellitus with neurological manifestations, not stated as uncontrolled(250.60) 07/05/2014  . Hyperthyroidism without crisis 06/02/2014  . Routine history and physical examination of adult 04/05/2014  . Type II diabetes mellitus with renal manifestations ([place]) 03/07/2014  . Abdominal distension 02/01/2014  . Flatulence, eructation and gas pain 02/01/2014  . Constipation 12/16/2013  . Dyspepsia 11/16/2013  . Dyspepsia and other specified disorders of function of stomach 11/16/2013  . Dorsalgia, unspecified 10/21/2013  . Troponin level elevated 10/21/2013  . Chronic diastolic heart failure ([place])   . Hypertension   . Nontoxic uninodular goiter 02/02/2013  . Malignant neoplasm of vulva ([place]) 09/17/2012   PCP:  [name], MD Pharmacy:   [facility] [place], [place] [address] Phone: [phone] Fax: [place], [place] - [address] [place] [address] Phone: [phone] Fax: [phone]     Social Determinants of Health (SDOH) Interventions    Readmission Risk Interventions No flowsheet data found.

## 2020-04-27 NOTE — Progress Notes (Signed)
[facility] hospital liaison note:  Follow up visit made to new referral for [facility] hospice services at home.  Patient with eyes closed, but alert when spoken to. [place] at bed side. Bed to be delivered by noon. Hospital care team updated.  Patient will require EMS transport. Signed out of facility DNR in place in patient's chart. Will continue to follow through discharge. Flo Shanks BSN, RN, [place] [phone]

## 2020-04-27 NOTE — Progress Notes (Signed)
[facility] here to transport patient home with hospice.

## 2020-04-27 NOTE — Discharge Summary (Signed)
Physician Discharge Summary  Chelsea Davis HKV:[id number] DOB: 09-20-1936 DOA: 04/25/2020  PCP: [name], MD  Admit date: 04/25/2020 Discharge date: 04/27/2020  Admitted From:  Home Disposition: Home with hospice  Recommendations for Outpatient Follow-up:  1. Follow up with PCP in 1-2 weeks 2. Please obtain BMP/CBC in one week 3. Please follow up on the following pending results: None  Home Health: Home hospice Equipment/Devices: Home hospice equipment Discharge Condition: Guarded CODE STATUS: DNR Diet recommendation: Heart Healthy / Carb Modified / Regular / Dysphagia   Brief/Interim Summary: Chelsea Davis a 84 y.o.femalewith medical history significant ofhypertension, hyperlipidemia, diabetes mellitus, CKD-3,dCHF, hypothyroidism, malignant neoplasm of vulva(s/p of surgery in [facility]), pancreatic mass, who presents with left-sided weakness and slurred speech. She had PET scan yesterday, the findings are with high suspicion for malignancy.  Found to have a multifocal infarct involving multiple locations on MRI.  Most likely secondary to hypercoagulable stage with malignancy. Patient is dependent for her ADLs at baseline.  Poor functional status at baseline.  MRI with multifocal infarct involving different territories, more consistent with embolic phenomenon.  Patient might have hypercoagulability stage due to her malignancy. Echocardiogram with hypertrophic cardiomyopathy, hyperdynamic EF and grade 2 diastolic dysfunction.  A1c at 5.2, left third profile of total cholesterol of 194, triglyceride of 206, HDL of 18 and LDL of 135.  INR elevated at 1.5.  Worsening T bili at 2.1.  More consistent with hepatic dysfunction. Patient failed swallow evaluation with severe oropharyngeal dysfunction. Patient also has worsening thrombocytopenia.  Due to her poor functional status at baseline along with superadded multiple cerebral infarcts, inability to swallow, underlying malignancy  palliative care was consulted and patient was transitioned to full comfort care by family as they do not want any heroic measures.  Comfort measures initiated in the hospital.  And she is being discharged to home with home hospice services according to her wishes.  She has a bad prognosis.  Discharge Diagnoses:  Principal Problem:   Stroke [facility]) Active Problems:   Chronic diastolic heart failure (HCC)   Hypertension   Type II diabetes mellitus with renal manifestations (HCC)   Acute renal failure superimposed on stage 3a chronic kidney disease (HCC)   Hyperlipidemia   Essential hypertension   Pancreatic mass   Leukocytosis   Thrombocytopenia (HCC)   CVA (cerebral vascular accident) [facility])   Discharge Instructions  Discharge Instructions    Diet - low sodium heart healthy   Complete by: As directed    Increase activity slowly   Complete by: As directed      Allergies as of 04/27/2020      Reactions   Iodinated Diagnostic Agents Hives, Rash, Swelling   Amlodipine    Other reaction(s): Unknown edema   Coreg [carvedilol]    Bradycardia   Dye Fdc Red [red Dye]    Iodides Hives, Rash, Swelling   Metformin Diarrhea   Spironolactone Rash      Medication List    STOP taking these medications   atorvastatin 20 MG tablet Commonly known as: LIPITOR   lipase/protease/amylase 36000 UNITS Cpep capsule Commonly known as: Creon   losartan 100 MG tablet Commonly known as: COZAAR     TAKE these medications   amLODipine 5 MG tablet Commonly known as: NORVASC Take 5 mg by mouth daily.   antiseptic oral rinse Liqd Apply 15 mLs topically as needed for dry mouth.   polyvinyl alcohol 1.4 % ophthalmic solution Commonly known as: LIQUIFILM TEARS Place 1 drop  into both eyes 4 (four) times daily as needed for dry eyes.   ProAir HFA 108 (90 Base) MCG/ACT inhaler Generic drug: albuterol Inhale 2 puffs into the lungs every 6 (six) hours as needed for wheezing or shortness of  breath.       Allergies  Allergen Reactions  . Iodinated Diagnostic Agents Hives, Rash and Swelling  . Amlodipine     Other reaction(s): Unknown edema  . Coreg [Carvedilol]     Bradycardia   . Dye Fdc Red [Red Dye]   . Iodides Hives, Rash and Swelling  . Metformin Diarrhea  . Spironolactone Rash    Consultations:  Neurology  Palliative care  Procedures/Studies: CT ABDOMEN PELVIS WO CONTRAST  Addendum Date: 04/13/2020   ADDENDUM REPORT: 04/13/2020 11:40 ADDENDUM: The original report was by Dr. Van Clines. The following addendum is by Dr. Van Clines: These results were called by telephone at the time of interpretation on 04/13/2020 at 11:23 am to provider Dr. Delight Stare , who verbally acknowledged these results. Please note also that the final sentence under impression # 1 should read, "The pelvic adenopathy and mild retroperitoneal adenopathy are mildly unusual features, and raise the less likely possibility of lymphoma with involvement of the pancreas AS OPPOSED TO pancreatic adenocarcinoma." Electronically Signed   By: Van Clines M.D.   On: 04/13/2020 11:40   Result Date: 04/13/2020 CLINICAL DATA:  Anorexia, bloating, diarrhea, and abdominal pain over the last 2-3 weeks. Weakness. EXAM: CT ABDOMEN AND PELVIS WITHOUT CONTRAST TECHNIQUE: Multidetector CT imaging of the abdomen and pelvis was performed following the standard protocol without IV contrast. COMPARISON:  11/29/2013 FINDINGS: Lower chest: Mild scarring or atelectasis in the lingula. Contrast medium in the distal esophagus suggesting reflux or dysmotility. Left anterior descending and right coronary artery atherosclerosis with atherosclerotic calcification of the descending thoracic aorta. Hepatobiliary: Unusual heterogeneity in the portal vein and SMV with unusual prominence of the right portal vein on noncontrast imaging, suspicious for portal vein thrombosis. The gallbladder appears unremarkable.  Pancreas: 7.9 by 4.0 by 4.6 cm mass in the tail the pancreas, abutting and possibly invading the adjacent gastric wall. Adjacent peripancreatic adenopathy including a 1.9 cm peripancreatic node on image 28/2 and a 1.3 cm right gastric node on image 27/2. A peripancreatic node on image 34/2 measures 1.2 cm in diameter. Some of this indistinct adenopathy extends adjacent to the common hepatic artery on image 28/2. Please note that today's exam is a noncontrast exam and not well suited for the staging workup of pancreatic adenocarcinoma. Spleen: Unremarkable Adrenals/Urinary Tract: Unremarkable Stomach/Bowel: As noted above, the pancreatic tail mass abuts the posterior margin of the stomach. No dilated bowel. Vascular/Lymphatic: As noted above, concern for the possibility of portal vein and SMV thrombosis, although ultrasound or contrasted exam would be able to show this with more confidence. Aortoiliac atherosclerotic vascular disease. Peripancreatic adenopathy. Left external iliac node 1.0 cm in short axis, image 47/2. Right common iliac node 1.0 cm in short axis, image 50/2. Right external iliac node 1.4 cm in short axis, image 66/2. Left external iliac node 1.1 cm in short axis, image 63/2. Additional mild pelvic adenopathy is noted. Reproductive: Uterus absent.  Adnexa unremarkable. Other: Perihepatic and perisplenic ascites. Trace ascites in the pelvis. Musculoskeletal: Transitional S1 vertebra. Schmorl's node along the inferior endplate of L5. There is potentially mild bilateral foraminal impingement at L4-5 and L5-S1 due to spondylosis and degenerative disc disease. IMPRESSION: 1. 7.9 by 4.0 by 4.6 cm mass in the tail  the pancreas, abutting and possibly invading the adjacent gastric wall, with adjacent peripancreatic and also with pelvic adenopathy. Appearance favors pancreatic adenocarcinoma. The pelvic adenopathy and mild retroperitoneal adenopathy is a mildly unusual feature, and raises the less likely  possibility of lymphoma with involvement of the pancreas is post pancreatic adenocarcinoma. 2. Unusual heterogeneity in the portal vein and SMV with unusual prominence of the right portal vein on noncontrast imaging, suspicious for portal vein and SMV thrombosis. By report the patient has a history of CT contrast allergy, and laps from several years ago showed renal dysfunction. Particularly if the GFR is above 25, pancreatic protocol MRI with and without contrast should be considered. Alternative vascular workup of the portal vein could be performed using portal venous ultrasound. In the setting of oncology staging workup, nuclear medicine PET-CT might also play a complementary role. 3. Other imaging findings of potential clinical significance: Perihepatic and perisplenic ascites. Coronary atherosclerosis. Contrast medium in the distal esophagus suggesting reflux or dysmotility. Trace ascites in the pelvis. Transitional S1 vertebra. There is potentially mild bilateral foraminal impingement at L4-5 and L5-S1 due to spondylosis and degenerative disc disease. 4. Aortic atherosclerosis. Aortic Atherosclerosis (ICD10-I70.0). Radiology assistant personnel have been notified to put me in telephone contact with the referring physician or the referring physician's clinical representative in order to discuss these findings. Once this communication is established I will issue an addendum to this report for documentation purposes. Electronically Signed: By: Van Clines M.D. On: 04/13/2020 11:15   CT HEAD WO CONTRAST  Result Date: 04/25/2020 CLINICAL DATA:  Left-sided facial droop and slurred speech. EXAM: CT HEAD WITHOUT CONTRAST TECHNIQUE: Contiguous axial images were obtained from the base of the skull through the vertex without intravenous contrast. COMPARISON:  CT scan 01/01/2018 FINDINGS: Brain: Stable mild age related cerebral atrophy, ventriculomegaly and periventricular white matter disease. Stable bilateral  basal ganglia calcifications. No extra-axial fluid collections are identified. No CT findings for acute hemispheric infarction or intracranial hemorrhage. No mass lesions. The brainstem and cerebellum are normal. Vascular: Stable vascular calcifications. No aneurysm or hyperdense vessels. Skull: No skull fracture or bone lesions. Sinuses/Orbits: The paranasal sinuses and mastoid air cells are clear. The globes are intact. Other: No scalp lesions or hematoma. IMPRESSION: 1. Stable mild age related cerebral atrophy, ventriculomegaly and periventricular white matter disease. 2. No acute intracranial findings or mass lesions. Electronically Signed   By: Marijo Sanes M.D.   On: 04/25/2020 12:20   MR ANGIO HEAD WO CONTRAST  Result Date: 04/25/2020 CLINICAL DATA:  Stroke EXAM: MRA HEAD WITHOUT CONTRAST TECHNIQUE: Angiographic images of the Circle of Willis were obtained using MRA technique without intravenous contrast. COMPARISON:  MRI head 04/25/2020 FINDINGS: Both vertebral arteries patent to the basilar. Mild stenosis distal left vertebral artery. Right PICA patent. Left PICA not visualized. Basilar widely patent. Superior cerebellar and posterior cerebral arteries patent bilaterally. No significant stenosis. Fetal origin left posterior cerebral artery. Irregular narrowing of the internal carotid artery at the skull base on the left may be due to atherosclerotic disease or artifact. Right internal carotid artery widely patent. Cavernous carotid patent bilaterally. Anterior and middle cerebral arteries patent bilaterally. Irregularity of MCA branches bilaterally likely due to atherosclerotic disease. Mild irregularity in the anterior cerebral arteries. IMPRESSION: Negative for large vessel occlusion Atherosclerotic irregularity in the anterior and middle cerebral arteries bilaterally. Electronically Signed   By: Franchot Gallo M.D.   On: 04/25/2020 17:55   MR BRAIN WO CONTRAST  Result Date: 04/25/2020 CLINICAL  DATA:  Focal neurological deficit EXAM: MRI HEAD WITHOUT CONTRAST TECHNIQUE: Multiplanar, multiecho pulse sequences of the brain and surrounding structures were obtained without intravenous contrast. COMPARISON:  Prior same day head CT. FINDINGS: Brain: Multifocal scattered bilateral cerebral and cerebellar foci of restricted diffusion involving the left caudate, bilateral frontoparietal and occipital regions. The largest acute infarct involves the right corona radiata (5:34). There are also acute infarcts involving the right hippocampal tail and possibly dorsal left temporal region. Mild cerebral atrophy with ex vacuo dilatation. Background scattered T2/FLAIR hyperintense foci involving the periventricular white matter are nonspecific however commonly associated with chronic microvascular ischemic changes. No midline shift, mass lesion or extra-axial fluid collection. No intracranial hemorrhage. Vascular: Grossly normal flow voids. Skull and upper cervical spine: Normal marrow signal. Sinuses/Orbits: Normal orbits. Clear paranasal sinuses. No mastoid effusion. Other: Partially imaged left cheek cystic lesion. IMPRESSION: Multifocal cerebral and cerebellar acute infarcts, likely embolic with involvement of the left caudate and right hippocampal tail. The largest infarct involves the right corona radiata. Mild cerebral atrophy. Mild background chronic microvascular ischemic changes. Indeterminate left cheek cystic lesion. Correlation with patient history and physical exam is recommended. Consider outpatient dermatology consult. These results were called by telephone at the time of interpretation on 04/25/2020 at 2:31 pm to provider Lenise Arena , who verbally acknowledged these results. Electronically Signed   By: Primitivo Gauze M.D.   On: 04/25/2020 14:36   US Carotid Bilateral (at [facility] and [facility] only)  Result Date: 04/26/2020 CLINICAL DATA:  84 year old female with a history of stroke EXAM: BILATERAL  CAROTID DUPLEX ULTRASOUND TECHNIQUE: Pearline Cables scale imaging, color Doppler and duplex ultrasound were performed of bilateral carotid and vertebral arteries in the neck. COMPARISON:  None. FINDINGS: Criteria: Quantification of carotid stenosis is based on velocity parameters that correlate the residual internal carotid diameter with NASCET-based stenosis levels, using the diameter of the distal internal carotid lumen as the denominator for stenosis measurement. The following velocity measurements were obtained: RIGHT ICA:  Systolic 46 cm/sec, Diastolic 8 cm/sec CCA:  76 cm/sec SYSTOLIC ICA/CCA RATIO:  0.6 ECA:  66 cm/sec LEFT ICA:  Systolic 51 cm/sec, Diastolic 7 cm/sec CCA:  85 cm/sec SYSTOLIC ICA/CCA RATIO:  0.6 ECA:  69 cm/sec Right Brachial SBP: Not acquired Left Brachial SBP: Not acquired RIGHT CAROTID ARTERY: No significant calcifications of the right common carotid artery. Intermediate waveform maintained. Heterogeneous and partially calcified plaque at the right carotid bifurcation. No significant lumen shadowing. Low resistance waveform of the right ICA. No significant tortuosity. RIGHT VERTEBRAL ARTERY: Antegrade flow with low resistance waveform. LEFT CAROTID ARTERY: No significant calcifications of the left common carotid artery. Intermediate waveform maintained. Heterogeneous and partially calcified plaque at the left carotid bifurcation without significant lumen shadowing. Low resistance waveform of the left ICA. No significant tortuosity. LEFT VERTEBRAL ARTERY:  Antegrade flow with low resistance waveform. IMPRESSION: Color duplex indicates minimal heterogeneous and calcified plaque, with no hemodynamically significant stenosis by duplex criteria in the extracranial cerebrovascular circulation. Signed, Dulcy Fanny. Dellia Nims, RPVI [facility] Electronically Signed   By: Corrie Mckusick D.O.   On: 04/26/2020 09:27   NM PET Image Initial (PI) Skull Base  To Thigh  Result Date: 04/25/2020 CLINICAL DATA:  Initial treatment strategy for pancreatic tail mass. Remote history of vulvar squamous cell carcinoma. EXAM: NUCLEAR MEDICINE PET SKULL BASE TO THIGH TECHNIQUE: 8.3 mCi F-18 FDG was injected intravenously. Full-ring PET imaging was performed from the skull base to thigh after the  radiotracer. CT data was obtained and used for attenuation correction and anatomic localization. Fasting blood glucose: 130 mg/dl COMPARISON:  04/13/2020 CT scan FINDINGS: Mediastinal blood pool activity: SUV max 3.8 Liver activity: SUV max 4.6 NECK: 3.0 by 2.9 cm solid left thyroid nodule has similar activity to the rest of thyroid is not hypermetabolic. This was previously shown on the CT cervical spine from 01/01/2018 and thyroid ultrasound was recommended at that time. This has been evaluated on previous imaging, although correlation with patient history is recommended to ensure that the recommended thyroid workup was performed. (Ref: J Am Coll Radiol. 2015 Feb;12(2): 143-50). Incidental CT findings: Small subcutaneous cystic lesion along the left face in similar lesions along the left upper back near the base of the neck, probably sebaceous cysts or similar benign lesions. CHEST: Right axillary lymph node 0.8 cm in short axis on image 75/4 with maximum SUV 6.0. Fullness of density along the left axillary neurovascular structures probably reflecting an adjacent lymph node, maximum SUV is only 2.7. Incidental CT findings: Coronary, aortic arch, and branch vessel atherosclerotic vascular disease. Mild cardiomegaly. Trace left pleural effusion and trace pericardial effusion. ABDOMEN/PELVIS: Abnormal expansion of the pancreatic tail corresponding to a mass with central necrosis and hypermetabolic rim, versus a mass associated with marked dilatation of the dorsal pancreatic duct. The abnormal hypermetabolic lesion has a maximum SUV of 10.0 and measures approximately 9.1 by 5.0 by 5.6 cm  There is some accentuated signal tracking in the portal vein and superior mesenteric vein suspicious for tumor thrombus, maximum SUV 5.6 which is substantially higher than the normal blood pool. Small but hypermetabolic retrocrural, periaortic, common iliac, internal iliac, and external iliac adenopathy. Index retrocaval node measuring 0.8 cm in short axis on image 148/4, maximum SUV 8.7. Index left external iliac node 1.0 cm in short axis on image 200/4, maximum SUV 13.0. Index right external iliac node 1.2 cm in short axis on image 206/4, maximum SUV 12.0. Scattered accentuated metabolic activity in loops of bowel. The only region of moderate suspicion is the focal circumferential activity in the rectum corresponding to potential soft tissue prominence in the rectum in the vicinity of image 210/4, maximum SUV 19.6 Incidental CT findings: Aortoiliac atherosclerotic vascular disease. Mild ascites most notable in the perihepatic region. SKELETON: No significant abnormal hypermetabolic activity in this region. Incidental CT findings: none IMPRESSION: 1. Unusual pattern of hypermetabolic adenopathy in the abdomen and pelvis along with a large centrally necrotic or cystic mass in the pancreatic tail with hypermetabolic margins. 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 High suspicion for malignancy but this would be an unusual pattern for typical pancreatic adenocarcinoma. There is also focal hypermetabolic activity in a region of soft tissue prominence in the rectum which could be a rectal adenocarcinoma. Differential diagnostic possibilities include metastatic rectal cancer, an unusual spread of pancreatic malignancy (with the rectal activity being spurious/physiologic), or lymphoma with pancreatic and possibly rectal involvement. If not recently performed, colonoscopy or at least sigmoidoscopy would be recommended to assess the potential mid rectal mass. Assuming that  standard pancreatic protocol MRI with and without contrast cannot be performed for further characterization of the pancreatic mass, tissue diagnosis from the pancreas may be warranted. 2. There is also a hypermetabolic right axillary lymph node concerning for metastatic spread. Equivocal soft tissue density along the left axillary neurovascular structures without hypermetabolic activity. 3. Left thyroid nodule similar to appearance on 01/01/2018. This has been evaluated on previous imaging,  at the time of prior characterization on 01/01/2018 a thyroid ultrasound was recommended, correlate with patient history in determining whether this was performed. (Ref: J Am Coll Radiol. 2015 Feb;12(2): 143-50). 4. Other imaging findings of potential clinical significance: Aortic Atherosclerosis (ICD10-I70.0). Coronary atherosclerosis with mild cardiomegaly. Trace left pleural effusion with trace pericardial effusion. Small amount of ascites. Electronically Signed   By: Van Clines M.D.   On: 04/25/2020 09:02   ECHOCARDIOGRAM COMPLETE  Result Date: 04/26/2020    ECHOCARDIOGRAM REPORT   Patient Name:   Chelsea Davis Date of Exam: 04/26/2020 Medical Rec #:  [id number]       Height:       56.0 in Accession #:    [id number]      Weight:       105.0 lb Date of Birth:  07-07-1936        BSA:          1.350 m Patient Age:    84 years        BP:           147/52 mmHg Patient Gender: F               HR:           61 bpm. Exam Location:  [facility] Procedure: 2D Echo, Color Doppler and Cardiac Doppler Indications:     I163.9 Stroke  History:         Patient has prior history of Echocardiogram examinations. CHF,                  CKD; Risk Factors:Hypertension, Diabetes and Dyslipidemia.  Sonographer:     Charmayne Sheer RDCS (AE) Referring Phys:  Baker Janus Soledad Gerlach NIU Diagnosing Phys: Kate Sable MD  Sonographer Comments: Suboptimal apical window and suboptimal subcostal window. IMPRESSIONS  1. Severe asymmetric septal hypertrophy, septal  wall measuring upto 48mm. findings consistent with HCM. Left ventricular ejection fraction, by estimation, is 70 to 75%. The left ventricle has hyperdynamic function. The left ventricle has no regional wall motion abnormalities. There is severe asymmetric left ventricular hypertrophy of the septal segment. Left ventricular diastolic parameters are consistent with Grade II diastolic dysfunction (pseudonormalization).  2. Right ventricular systolic function is normal. The right ventricular size is normal. There is normal pulmonary artery systolic pressure.  3. The mitral valve is normal in structure. No evidence of mitral valve regurgitation.  4. The aortic valve is tricuspid. Aortic valve regurgitation is not visualized. Mild aortic valve sclerosis is present, with no evidence of aortic valve stenosis.  5. The inferior vena cava is normal in size with greater than 50% respiratory variability, suggesting right atrial pressure of 3 mmHg. FINDINGS  Left Ventricle: Severe asymmetric septal hypertrophy, septal wall measuring upto 110mm. findings consistent with HCM. Left ventricular ejection fraction, by estimation, is 70 to 75%. The left ventricle has hyperdynamic function. The left ventricle has no  regional wall motion abnormalities. The left ventricular internal cavity size was small. There is severe asymmetric left ventricular hypertrophy of the septal segment. Left ventricular diastolic parameters are consistent with Grade II diastolic dysfunction (pseudonormalization). Right Ventricle: The right ventricular size is normal. No increase in right ventricular wall thickness. Right ventricular systolic function is normal. There is normal pulmonary artery systolic pressure. The tricuspid regurgitant velocity is 2.52 m/s, and  with an assumed right atrial pressure of 3 mmHg, the estimated right ventricular systolic pressure is 82.9 mmHg. Left Atrium: Left atrial size was normal in size. Right  Atrium: Right atrial size was  normal in size. Pericardium: There is no evidence of pericardial effusion. Mitral Valve: The mitral valve is normal in structure. No evidence of mitral valve regurgitation. MV peak gradient, 10.8 mmHg. The mean mitral valve gradient is 2.0 mmHg. Tricuspid Valve: The tricuspid valve is normal in structure. Tricuspid valve regurgitation is trivial. Aortic Valve: The aortic valve is tricuspid. Aortic valve regurgitation is not visualized. Mild aortic valve sclerosis is present, with no evidence of aortic valve stenosis. Aortic valve mean gradient measures 5.0 mmHg. Aortic valve peak gradient measures 11.3 mmHg. Aortic valve area, by VTI measures 2.06 cm. Pulmonic Valve: The pulmonic valve was not well visualized. Pulmonic valve regurgitation is not visualized. Aorta: The aortic root is normal in size and structure. Venous: The inferior vena cava is normal in size with greater than 50% respiratory variability, suggesting right atrial pressure of 3 mmHg. IAS/Shunts: No atrial level shunt detected by color flow Doppler.  LEFT VENTRICLE PLAX 2D LVIDd:         3.61 cm  Diastology LVIDs:         1.62 cm  LV e' lateral:   3.92 cm/s LV PW:         0.96 cm  LV E/e' lateral: 15.7 LV IVS:        1.26 cm  LV e' medial:    2.72 cm/s LVOT diam:     1.90 cm  LV E/e' medial:  22.6 LV SV:         65 LV SV Index:   48 LVOT Area:     2.84 cm  RIGHT VENTRICLE RV Basal diam:  1.87 cm LEFT ATRIUM             Index       RIGHT ATRIUM           Index LA diam:        2.60 cm 1.93 cm/m  RA Area:     10.30 cm LA Vol (A2C):   32.6 ml 24.15 ml/m RA Volume:   20.30 ml  15.04 ml/m LA Vol (A4C):   38.2 ml 28.30 ml/m LA Biplane Vol: 35.2 ml 26.08 ml/m  AORTIC VALVE                    PULMONIC VALVE AV Area (Vmax):    2.26 cm     PV Vmax:       1.48 m/s AV Area (Vmean):   2.52 cm     PV Vmean:      90.700 cm/s AV Area (VTI):     2.06 cm     PV VTI:        0.248 m AV Vmax:           168.00 cm/s  PV Peak grad:  8.8 mmHg AV Vmean:           103.000 cm/s PV Mean grad:  4.0 mmHg AV VTI:            0.317 m AV Peak Grad:      11.3 mmHg AV Mean Grad:      5.0 mmHg LVOT Vmax:         134.00 cm/s LVOT Vmean:        91.400 cm/s LVOT VTI:          0.230 m LVOT/AV VTI ratio: 0.73  AORTA Ao Root diam: 3.00 cm MITRAL VALVE  TRICUSPID VALVE MV Area (PHT): 2.11 cm     TR Peak grad:   25.4 mmHg MV Peak grad:  10.8 mmHg    TR Vmax:        252.00 cm/s MV Mean grad:  2.0 mmHg MV Vmax:       1.64 m/s     SHUNTS MV Vmean:      64.5 cm/s    Systemic VTI:  0.23 m MV Decel Time: 359 msec     Systemic Diam: 1.90 cm MV E velocity: 61.60 cm/s MV A velocity: 127.00 cm/s MV E/A ratio:  0.49 Kate Sable MD Electronically signed by Kate Sable MD Signature Date/Time: 04/26/2020/1:31:34 PM    Final      Subjective: Patient has no new complaint today when seen during morning rounds.  Remained aphasic.  Son was in the room.  He had some questions regarding giving her some water or food as patient keep asking.  Told that she can have some pleasure feeds and drinks and keep in mind that that can cause significant aspiration.  Patient is on comfort measures only with a very poor prognosis.  Discharge Exam: Vitals:   04/26/20 1423 04/27/20 0826  BP: (!) 149/55 (!) 144/60  Pulse: 67 65  Resp:  14  Temp: 97.9 F (36.6 C) 98.5 F (36.9 C)  SpO2: 100% 100%   Vitals:   04/26/20 0803 04/26/20 1314 04/26/20 1423 04/27/20 0826  BP: (!) 147/52 (!) 163/64 (!) 149/55 (!) 144/60  Pulse: 60 62 67 65  Resp: 20 16  14   Temp: 97.8 F (36.6 C) (!) 96.9 F (36.1 C) 97.9 F (36.6 C) 98.5 F (36.9 C)  TempSrc: Oral Axillary Oral   SpO2: 100% 100% 100% 100%  Weight:      Height:        General: Pt is alert, awake, not in acute distress Cardiovascular: RRR, S1/S2 +, no rubs, no gallops Respiratory: CTA bilaterally, no wheezing, no rhonchi Abdominal: Soft, NT, ND, bowel sounds + Extremities: no edema, no cyanosis   The results of significant  diagnostics from this hospitalization (including imaging, microbiology, ancillary and laboratory) are listed below for reference.    Microbiology: Recent Results (from the past 240 hour(s))  C difficile quick screen w PCR reflex     Status: None   Collection Time: 04/21/20  9:16 AM   Specimen: STOOL  Result Value Ref Range Status   C Diff antigen NEGATIVE NEGATIVE Final   C Diff toxin NEGATIVE NEGATIVE Final   C Diff interpretation No C. difficile detected.  Final    Comment: Performed at [facility], [address] [place], [place] [address]  SARS Coronavirus 2 by RT PCR (hospital order, performed in [facility] hospital lab) Nasopharyngeal Nasopharyngeal Swab     Status: None   Collection Time: 04/25/20 11:26 AM   Specimen: Nasopharyngeal Swab  Result Value Ref Range Status   SARS Coronavirus 2 NEGATIVE NEGATIVE Final    Comment: (NOTE) SARS-CoV-2 target nucleic acids are NOT DETECTED.  The SARS-CoV-2 RNA is generally detectable in upper and lower respiratory specimens during the acute phase of infection. The lowest concentration of SARS-CoV-2 viral copies this assay can detect is 250 copies / mL. A negative result does not preclude SARS-CoV-2 infection and should not be used as the sole basis for treatment or other patient management decisions.  A negative result may occur with improper specimen collection / handling, submission of specimen other than nasopharyngeal swab, presence  of viral mutation(s) within the areas targeted by this assay, and inadequate number of viral copies (<250 copies / mL). A negative result must be combined with clinical observations, patient history, and epidemiological information.  Fact Sheet for Patients:   StrictlyIdeas.no  Fact Sheet for Healthcare Providers: BankingDealers.co.za  This test is not yet approved or  cleared by the [place] FDA and has been authorized for detection  and/or diagnosis of SARS-CoV-2 by FDA under an Emergency Use Authorization (EUA).  This EUA will remain in effect (meaning this test can be used) for the duration of the COVID-19 declaration under Section 564(b)(1) of the Act, 21 U.S.C. section 360bbb-3(b)(1), unless the authorization is terminated or revoked sooner.  Performed at [facility], [address] [place], [place] [address]      Labs: BNP (last 3 results) Recent Labs    04/25/20 1126  BNP 956.2*   Basic Metabolic Panel: Recent Labs  Lab 04/25/20 1126  NA 141  K 4.7  CL 104  CO2 22  GLUCOSE 125*  BUN 48*  CREATININE 1.81*  CALCIUM 8.5*   Liver Function Tests: Recent Labs  Lab 04/25/20 1126  AST 23  ALT 21  ALKPHOS 63  BILITOT 2.1*  PROT 7.0  ALBUMIN 2.6*   No results for input(s): LIPASE, AMYLASE in the last 168 hours. No results for input(s): AMMONIA in the last 168 hours. CBC: Recent Labs  Lab 04/25/20 1126  WBC 12.6*  NEUTROABS 9.6*  HGB 13.5  HCT 41.3  MCV 94.3  PLT 74*   Cardiac Enzymes: No results for input(s): CKTOTAL, CKMB, CKMBINDEX, TROPONINI in the last 168 hours. BNP: Invalid input(s): POCBNP CBG: Recent Labs  Lab 04/24/20 1243 04/25/20 1116 04/25/20 1711 04/25/20 2121 04/26/20 0753  GLUCAP 130* 124* 105* 108* 95   D-Dimer No results for input(s): DDIMER in the last 72 hours. Hgb A1c Recent Labs    04/26/20 0501  HGBA1C 5.2   Lipid Profile Recent Labs    04/26/20 0501  CHOL 194  HDL 18*  LDLCALC 135*  TRIG 206*  CHOLHDL 10.8   Thyroid function studies No results for input(s): TSH, T4TOTAL, T3FREE, THYROIDAB in the last 72 hours.  Invalid input(s): FREET3 Anemia work up No results for input(s): VITAMINB12, FOLATE, FERRITIN, TIBC, IRON, RETICCTPCT in the last 72 hours. Urinalysis    Component Value Date/Time   COLORURINE Straw 11/29/2013 1026   APPEARANCEUR Clear 11/29/2013 1026   LABSPEC 1.006 11/29/2013 1026   PHURINE 7.0 11/29/2013  1026   GLUCOSEU 50 mg/dL 11/29/2013 1026   HGBUR Negative 11/29/2013 1026   BILIRUBINUR Negative 11/29/2013 1026   KETONESUR Trace 11/29/2013 1026   PROTEINUR Negative 11/29/2013 1026   NITRITE Negative 11/29/2013 1026   LEUKOCYTESUR Negative 11/29/2013 1026   Sepsis Labs Invalid input(s): PROCALCITONIN,  WBC,  LACTICIDVEN Microbiology Recent Results (from the past 240 hour(s))  C difficile quick screen w PCR reflex     Status: None   Collection Time: 04/21/20  9:16 AM   Specimen: STOOL  Result Value Ref Range Status   C Diff antigen NEGATIVE NEGATIVE Final   C Diff toxin NEGATIVE NEGATIVE Final   C Diff interpretation No C. difficile detected.  Final    Comment: Performed at [facility], [address] [place], [place] [address]  SARS Coronavirus 2 by RT PCR (hospital order, performed in [facility] hospital lab) Nasopharyngeal Nasopharyngeal Swab     Status: None   Collection Time: 04/25/20 11:26 AM  Specimen: Nasopharyngeal Swab  Result Value Ref Range Status   SARS Coronavirus 2 NEGATIVE NEGATIVE Final    Comment: (NOTE) SARS-CoV-2 target nucleic acids are NOT DETECTED.  The SARS-CoV-2 RNA is generally detectable in upper and lower respiratory specimens during the acute phase of infection. The lowest concentration of SARS-CoV-2 viral copies this assay can detect is 250 copies / mL. A negative result does not preclude SARS-CoV-2 infection and should not be used as the sole basis for treatment or other patient management decisions.  A negative result may occur with improper specimen collection / handling, submission of specimen other than nasopharyngeal swab, presence of viral mutation(s) within the areas targeted by this assay, and inadequate number of viral copies (<250 copies / mL). A negative result must be combined with clinical observations, patient history, and epidemiological information.  Fact Sheet for Patients:    StrictlyIdeas.no  Fact Sheet for Healthcare Providers: BankingDealers.co.za  This test is not yet approved or  cleared by the [place] FDA and has been authorized for detection and/or diagnosis of SARS-CoV-2 by FDA under an Emergency Use Authorization (EUA).  This EUA will remain in effect (meaning this test can be used) for the duration of the COVID-19 declaration under Section 564(b)(1) of the Act, 21 U.S.C. section 360bbb-3(b)(1), unless the authorization is terminated or revoked sooner.  Performed at [facility], [address] [place], [place] [address]     Time coordinating discharge: Over 30 minutes  SIGNED:  Lorella Nimrod, MD  [facility] 04/27/2020, 12:29 PM  If 7PM-7AM, please contact night-coverage [email or web address]  This record has been created using Systems analyst. Errors have been sought and corrected,but may not always be located. Such creation errors do not reflect on the standard of care.

## 2020-04-28 ENCOUNTER — Telehealth: Payer: Self-pay

## 2020-04-28 NOTE — Telephone Encounter (Signed)
Noted that Chelsea Davis went home with hospice/comfort measures following hospitalization. Navigator will sign off.

## 2020-05-07 DEATH — deceased

## 2020-05-11 ENCOUNTER — Inpatient Hospital Stay: Payer: Medicare Other

## 2021-06-24 IMAGING — MR MR MRA HEAD W/O CM
2 series · 19 of 48 positions shown · non-contrast
Comparison: MRI head 04/25/2020

CLINICAL DATA: Stroke

EXAM:
MRA HEAD WITHOUT CONTRAST
TECHNIQUE: Angiographic images of the Circle of Willis were obtained using MRA
technique without intravenous contrast.

[Series 5: TOF · axial · 0.5mm · 0.41mm/px · z∈[-73,+23]mm · 18 of 205 slices shown]
[im 1/205]
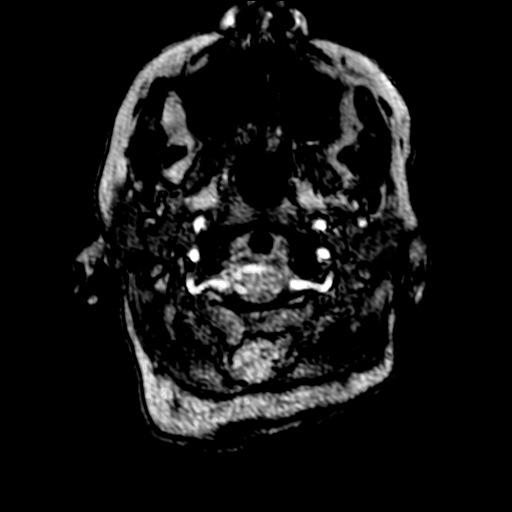
[im 5/205]
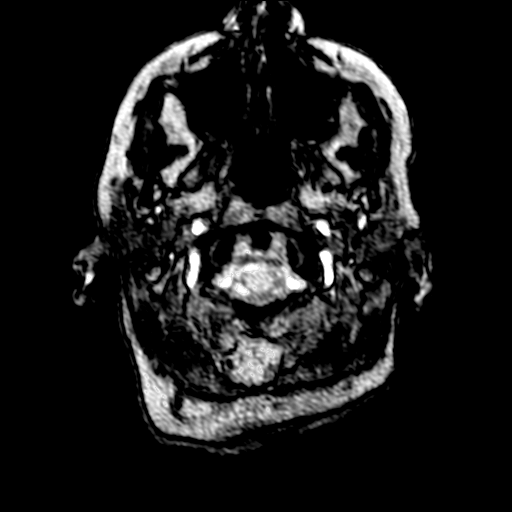
[im 9/205]
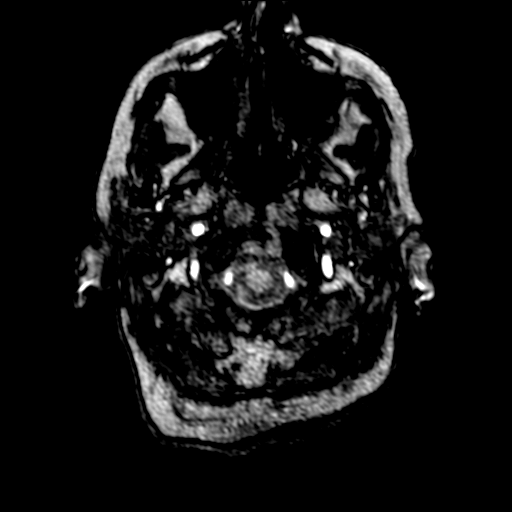
[im 14/205]
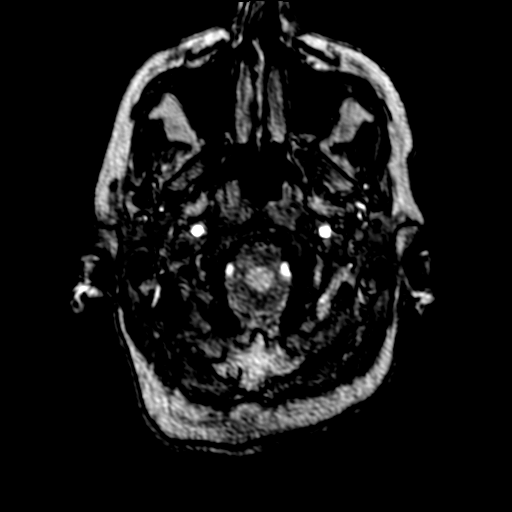
[im 18/205]
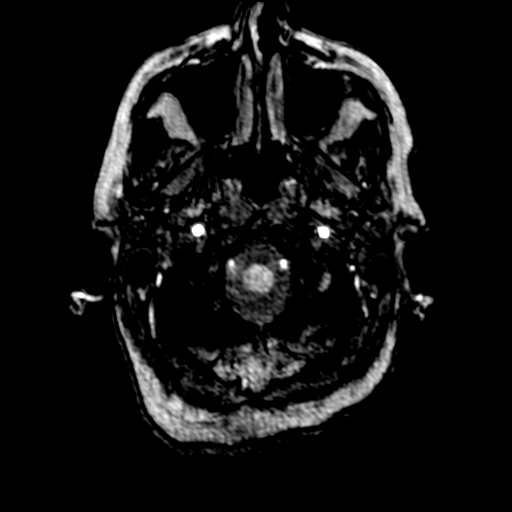
[im 23/205]
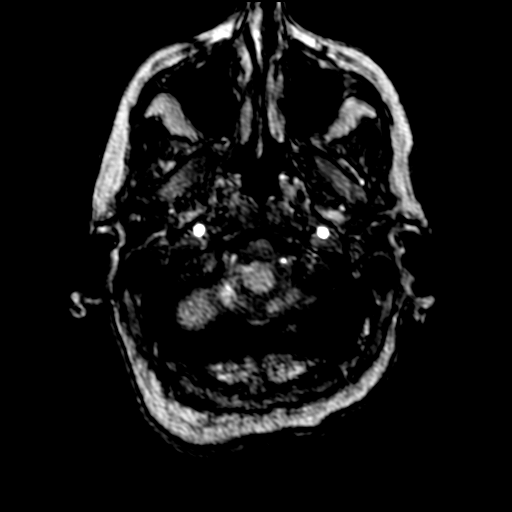
[im 27/205]
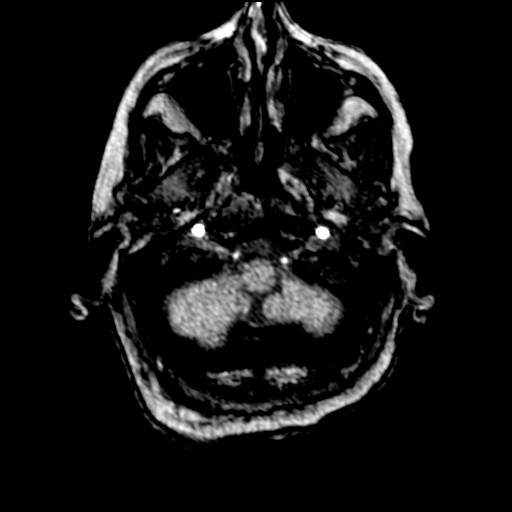
[im 32/205]
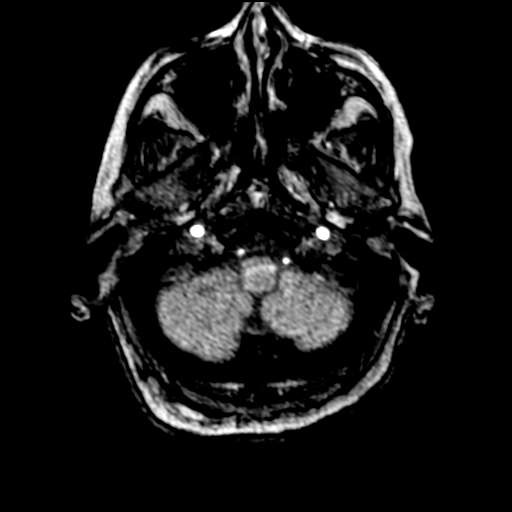
[im 36/205]
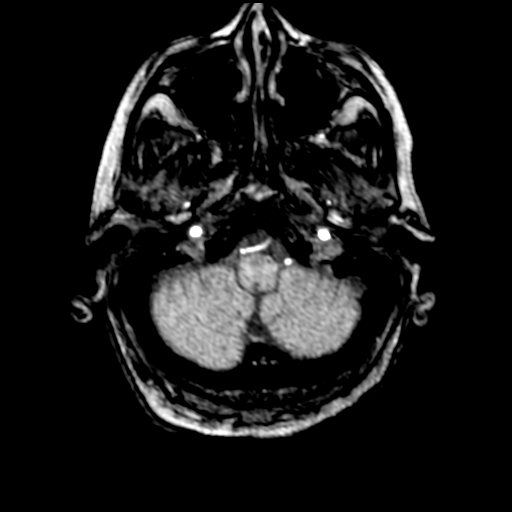
[im 40/205]
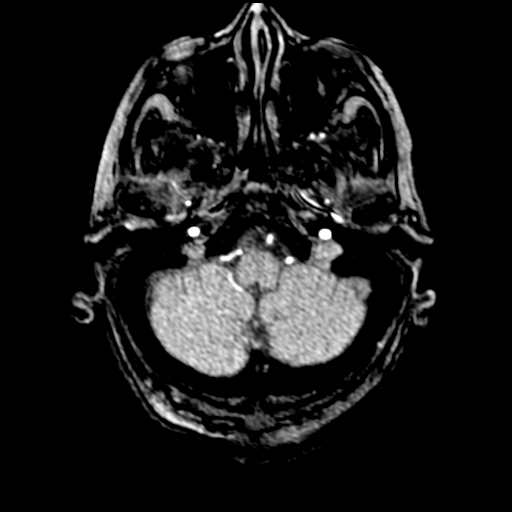
[im 63/205]
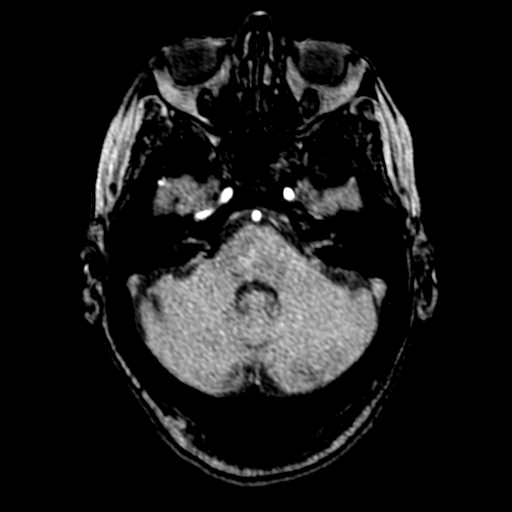
[im 89/205]
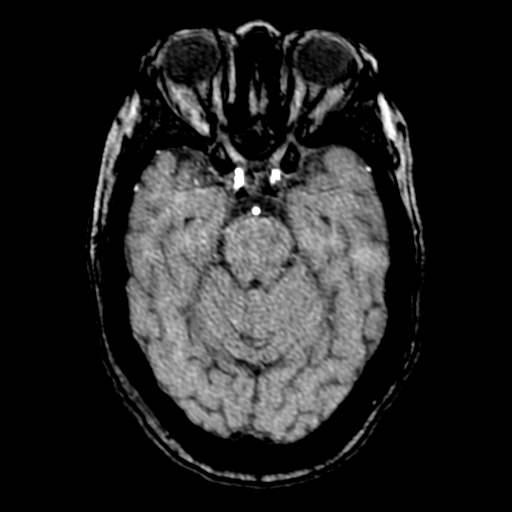
[im 103/205]
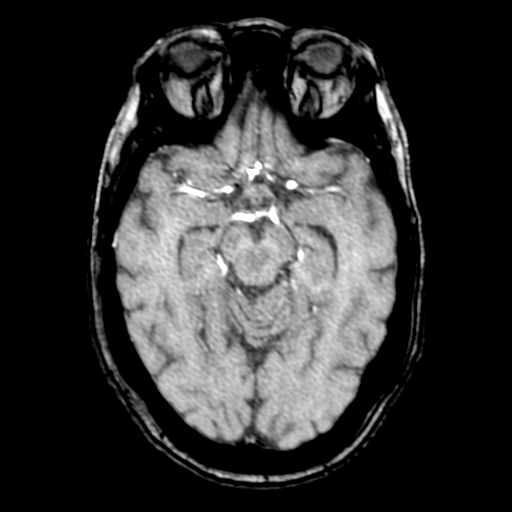
[im 116/205]
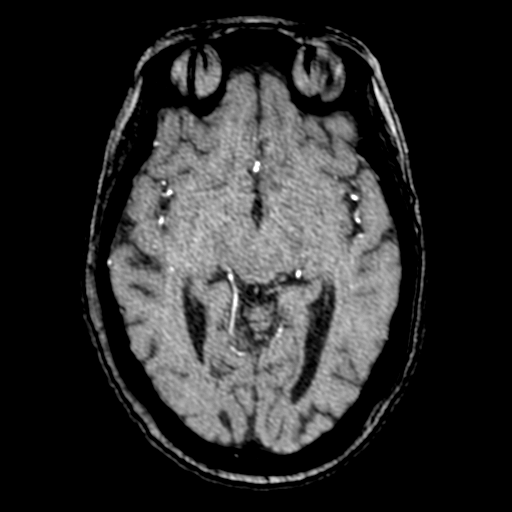
[im 142/205]
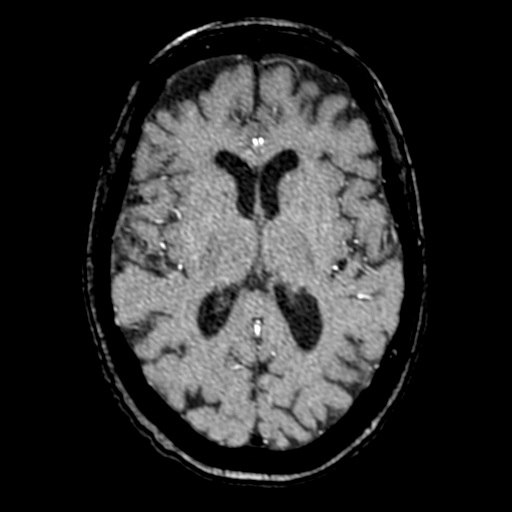
[im 169/205]
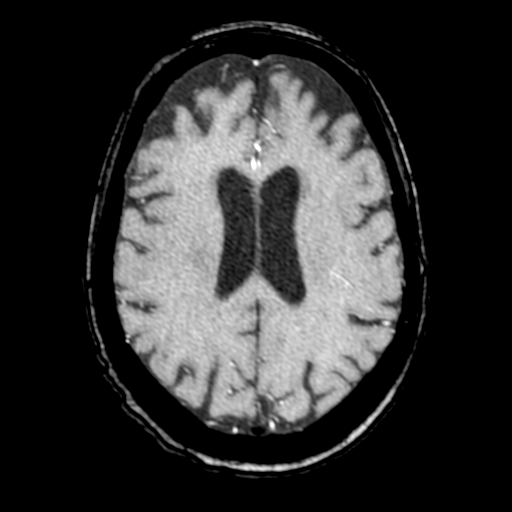
[im 173/205]
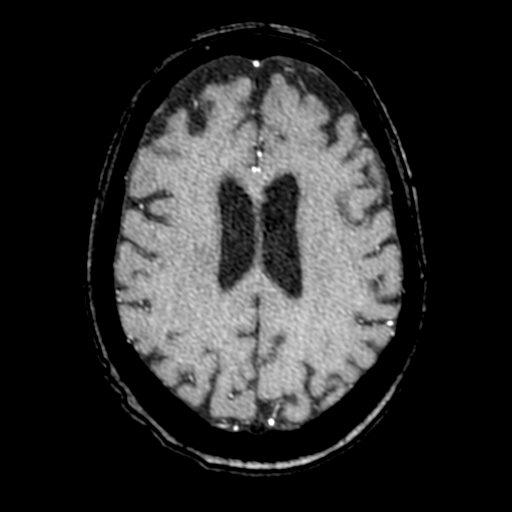
[im 196/205]
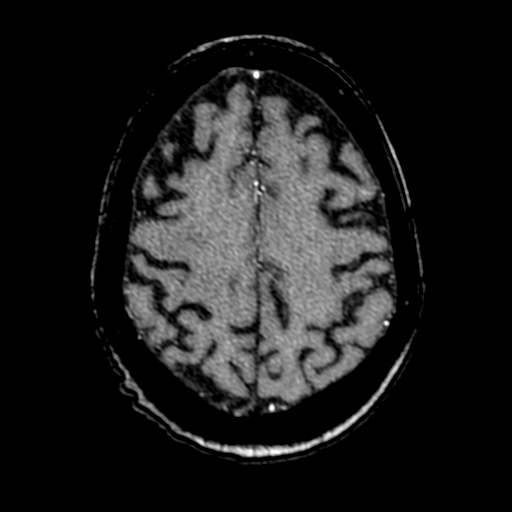

[Series 1058: ant tumble · axial · 0.4mm · 0.41mm/px · 1 of 1 slices shown]
[im 1/1]
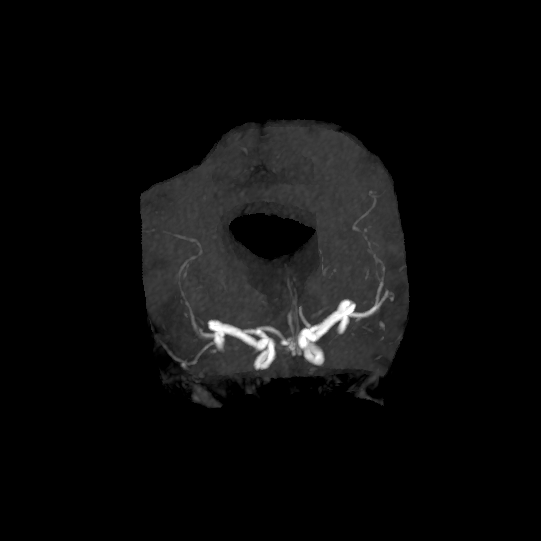

[19 of 48 positions shown; findings below may reference images not displayed]

FINDINGS: Both vertebral arteries patent to the basilar. Mild stenosis distal
left vertebral artery. Right PICA patent. Left PICA not visualized.
Basilar widely patent. Superior cerebellar and posterior cerebral
arteries patent bilaterally. No significant stenosis. Fetal origin
left posterior cerebral artery.

Irregular narrowing of the internal carotid artery at the skull base
on the left may be due to atherosclerotic disease or artifact. Right
internal carotid artery widely patent. Cavernous carotid patent
bilaterally. Anterior and middle cerebral arteries patent
bilaterally. Irregularity of MCA branches bilaterally likely due to
atherosclerotic disease. Mild irregularity in the anterior cerebral
arteries.
IMPRESSION: Negative for large vessel occlusion

Atherosclerotic irregularity in the anterior and middle cerebral
arteries bilaterally.

## 2021-06-25 IMAGING — US US CAROTID DUPLEX BILAT
1 series · 13 of 24 positions shown · non-contrast
Comparison: None.

CLINICAL DATA: 84-year-old female with a history of stroke

EXAM:
BILATERAL CAROTID DUPLEX ULTRASOUND
TECHNIQUE: Gray scale imaging, color Doppler and duplex ultrasound were
performed of bilateral carotid and vertebral arteries in the neck.

[Series 1: us carotid duplex bilat · 0.05mm/px · 13 of 69 slices shown]
[im 1/69]
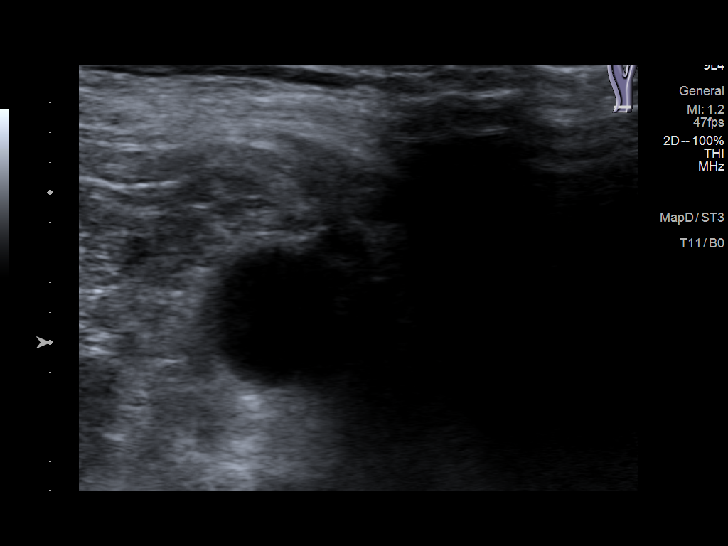
[im 6/69]
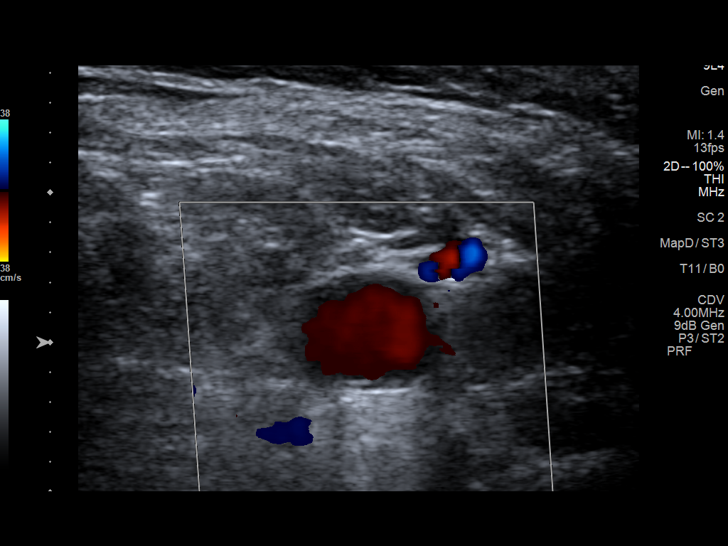
[im 12/69]
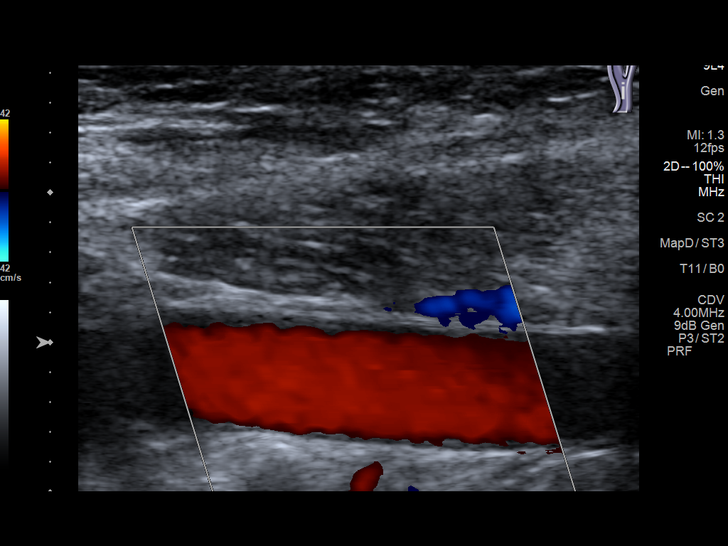
[im 18/69]
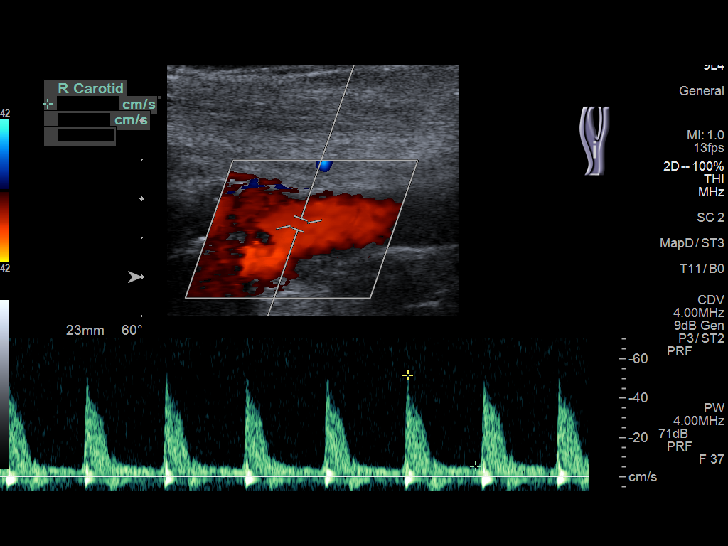
[im 24/69]
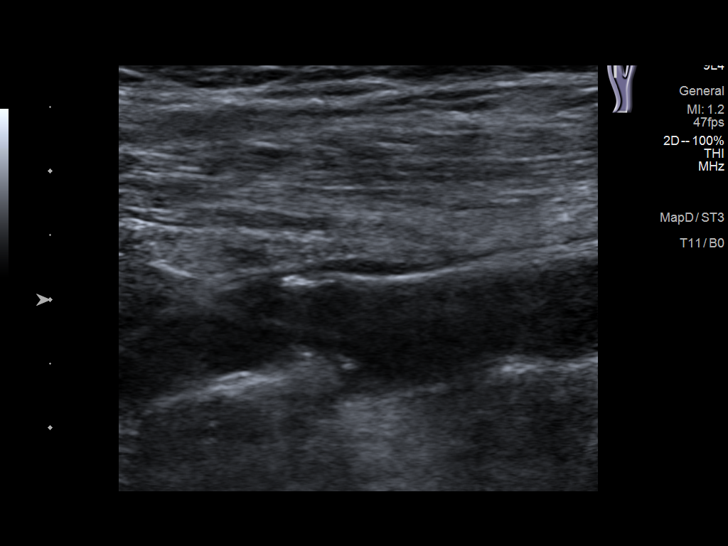
[im 30/69]
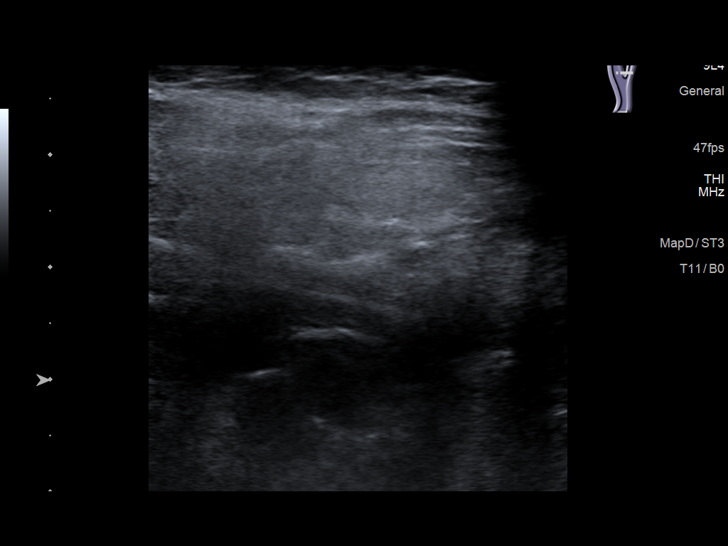
[im 36/69]
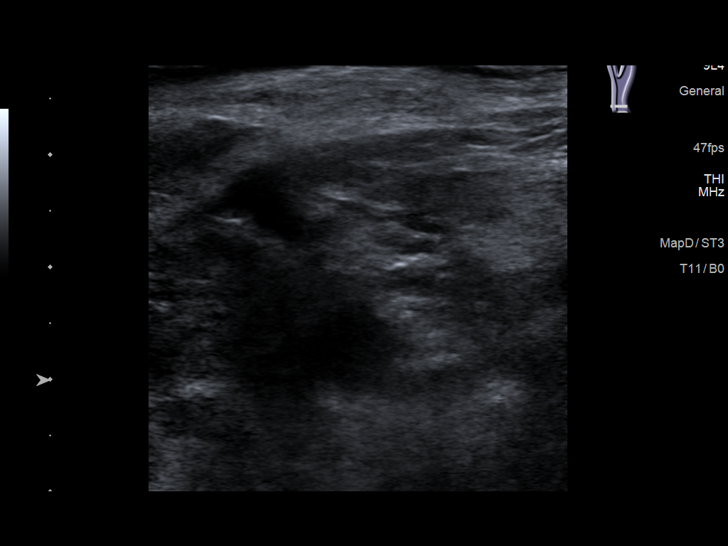
[im 39/69]
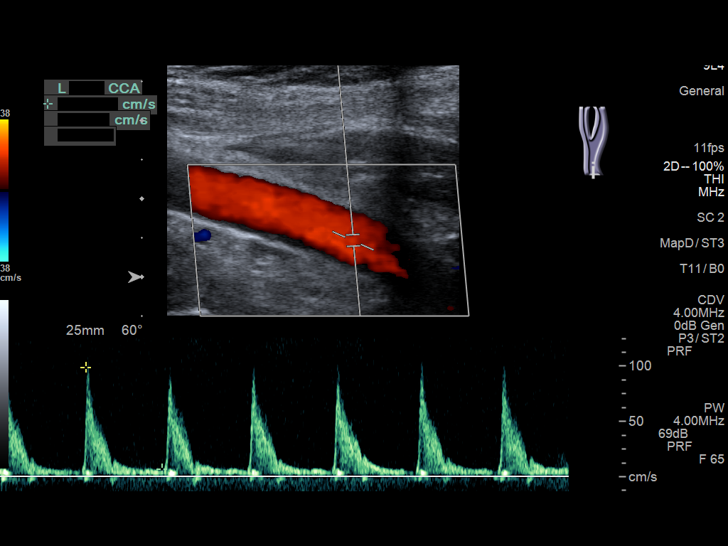
[im 45/69]
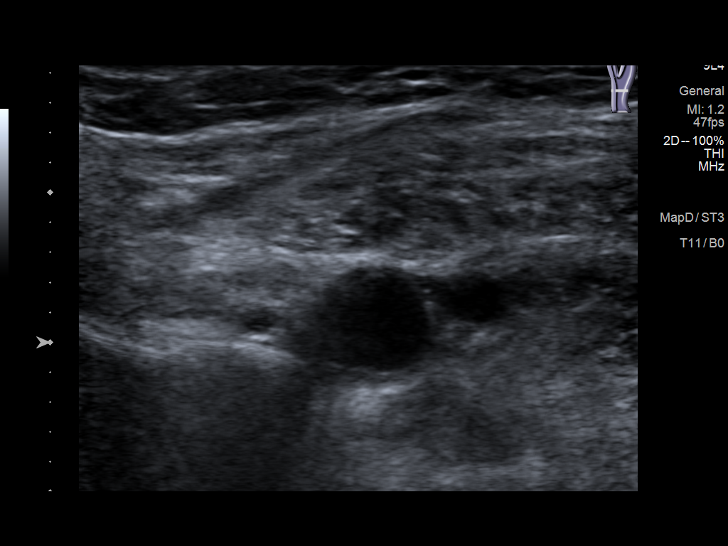
[im 51/69]
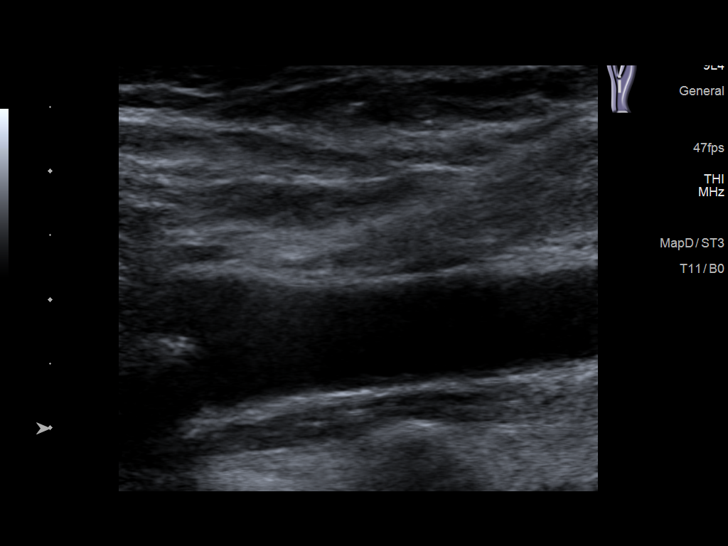
[im 57/69]
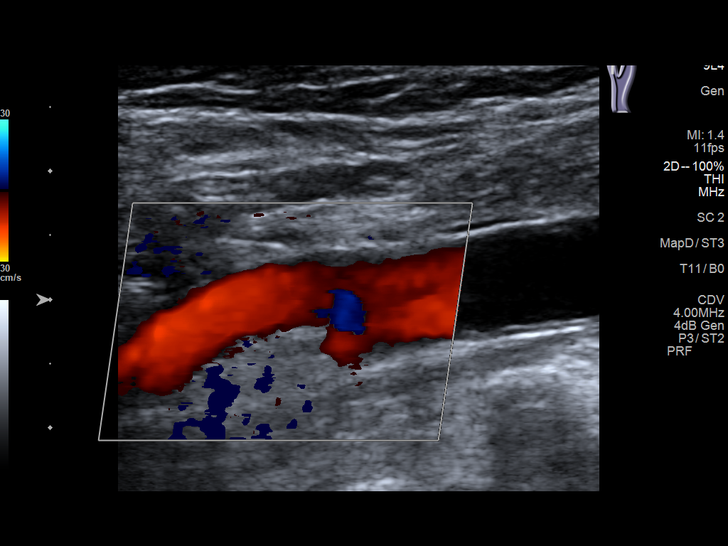
[im 63/69]
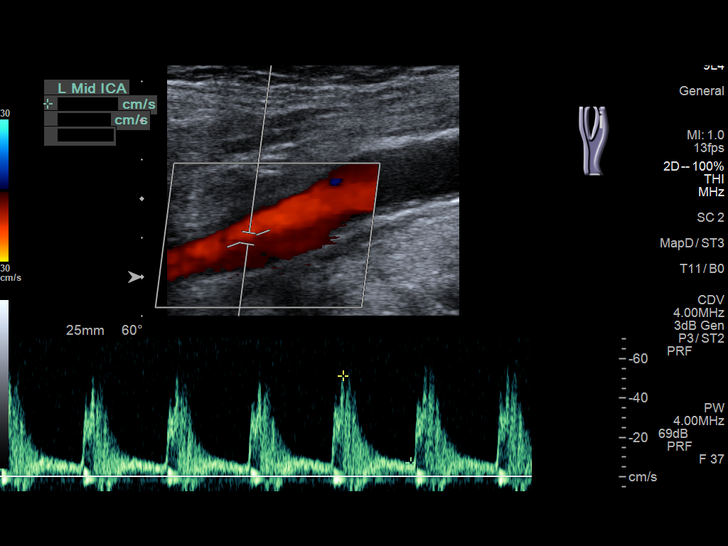
[im 69/69]
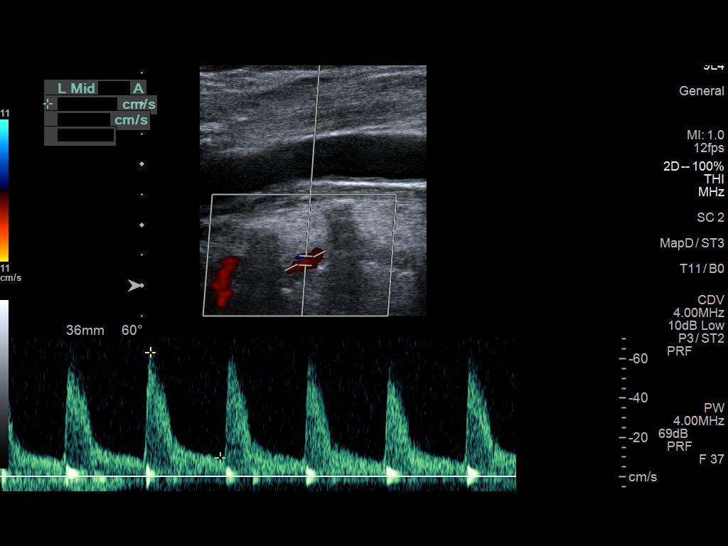

[13 of 24 positions shown; findings below may reference images not displayed]

FINDINGS: Criteria: Quantification of carotid stenosis is based on velocity
parameters that correlate the residual internal carotid diameter
with NASCET-based stenosis levels, using the diameter of the distal
internal carotid lumen as the denominator for stenosis measurement.

The following velocity measurements were obtained:

RIGHT

ICA:  Systolic 46 cm/sec, Diastolic 8 cm/sec

CCA:  76 cm/sec

SYSTOLIC ICA/CCA RATIO:

ECA:  66 cm/sec

LEFT

ICA:  Systolic 51 cm/sec, Diastolic 7 cm/sec

CCA:  85 cm/sec

SYSTOLIC ICA/CCA RATIO:

ECA:  69 cm/sec

Right Brachial SBP: Not acquired

Left Brachial SBP: Not acquired

RIGHT CAROTID ARTERY: No significant calcifications of the right
common carotid artery. Intermediate waveform maintained.
Heterogeneous and partially calcified plaque at the right carotid
bifurcation. No significant lumen shadowing. Low resistance waveform
of the right ICA. No significant tortuosity.

RIGHT VERTEBRAL ARTERY: Antegrade flow with low resistance waveform.

LEFT CAROTID ARTERY: No significant calcifications of the left
common carotid artery. Intermediate waveform maintained.
Heterogeneous and partially calcified plaque at the left carotid
bifurcation without significant lumen shadowing. Low resistance
waveform of the left ICA. No significant tortuosity.

LEFT VERTEBRAL ARTERY:  Antegrade flow with low resistance waveform.
IMPRESSION: Color duplex indicates minimal heterogeneous and calcified plaque,
with no hemodynamically significant stenosis by duplex criteria in
the extracranial cerebrovascular circulation.
